# Patient Record
Sex: Female | Born: 1975
Health system: Southern US, Community
[De-identification: ages and names within clinical notes are randomized; demographics above are authoritative.]

## PROBLEM LIST (undated history)

## (undated) DIAGNOSIS — R002 Palpitations: Secondary | ICD-10-CM

## (undated) HISTORY — DX: Palpitations: R00.2

---

## 2001-05-16 ENCOUNTER — Encounter: Payer: Self-pay | Admitting: Emergency Medicine

## 2001-05-16 ENCOUNTER — Emergency Department (HOSPITAL_COMMUNITY): Admission: EM | Admit: 2001-05-16 | Discharge: 2001-05-16 | Payer: Self-pay | Admitting: Internal Medicine

## 2008-01-25 ENCOUNTER — Emergency Department (HOSPITAL_COMMUNITY): Admission: EM | Admit: 2008-01-25 | Discharge: 2008-01-25 | Payer: Self-pay | Admitting: Emergency Medicine

## 2012-01-02 ENCOUNTER — Emergency Department (HOSPITAL_COMMUNITY): Payer: Worker's Compensation

## 2012-01-02 ENCOUNTER — Encounter (HOSPITAL_COMMUNITY): Payer: Self-pay | Admitting: Emergency Medicine

## 2012-01-02 ENCOUNTER — Emergency Department (HOSPITAL_COMMUNITY)
Admission: EM | Admit: 2012-01-02 | Discharge: 2012-01-02 | Disposition: A | Discharge status: Home / Self Care | Payer: Worker's Compensation | Attending: Emergency Medicine | Admitting: Emergency Medicine

## 2012-01-02 DIAGNOSIS — M545 Low back pain, unspecified: Secondary | ICD-10-CM | POA: Insufficient documentation

## 2012-01-02 DIAGNOSIS — S39012A Strain of muscle, fascia and tendon of lower back, initial encounter: Secondary | ICD-10-CM

## 2012-01-02 DIAGNOSIS — IMO0001 Reserved for inherently not codable concepts without codable children: Secondary | ICD-10-CM | POA: Insufficient documentation

## 2012-01-02 DIAGNOSIS — Y93F2 Activity, caregiving, lifting: Secondary | ICD-10-CM | POA: Insufficient documentation

## 2012-01-02 DIAGNOSIS — Y9210 Unspecified residential institution as the place of occurrence of the external cause: Secondary | ICD-10-CM | POA: Insufficient documentation

## 2012-01-02 DIAGNOSIS — S335XXA Sprain of ligaments of lumbar spine, initial encounter: Secondary | ICD-10-CM | POA: Insufficient documentation

## 2012-01-02 DIAGNOSIS — X500XXA Overexertion from strenuous movement or load, initial encounter: Secondary | ICD-10-CM | POA: Insufficient documentation

## 2012-01-02 MED ORDER — DIAZEPAM 5 MG PO TABS: 5.0000 mg | ORAL_TABLET | Freq: Three times a day (TID) | ORAL | Status: AC | PRN

## 2012-01-02 MED ORDER — IBUPROFEN 400 MG PO TABS: 400.0000 mg | ORAL_TABLET | Freq: Four times a day (QID) | ORAL | Status: AC | PRN

## 2012-01-02 MED ORDER — IBUPROFEN 800 MG PO TABS
800.0000 mg | ORAL_TABLET | Freq: Once | ORAL | Status: AC
  Administered 2012-01-02: 800 mg via ORAL
  Filled 2012-01-02: qty 1

## 2012-01-02 MED ORDER — HYDROCODONE-ACETAMINOPHEN 5-325 MG PO TABS: 1.0000 | ORAL_TABLET | ORAL | Status: AC | PRN

## 2012-01-02 NOTE — ED Provider Notes (Signed)
History     CSN: [id number]  Arrival date & time 01/02/12  0028   First MD Initiated Contact with Patient 01/02/12 0120      Chief Complaint  Patient presents with  . Back Pain    HPI: Patient is a 36 y.o. female presenting with back pain. The history is provided by the patient.  Back Pain  This is a new problem.  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 States she heard a pop in her lower back and has had severe LBP since. States pain is burning in nature and radiates slightly into her (R) buttocks. Denies numbness, tingling or weakness to the RLE.  History reviewed. No pertinent past medical history.  History reviewed. No pertinent past surgical history.  No family history on file.  History  Substance Use Topics  . Smoking status: Never Smoker   . Smokeless tobacco: Not on file  . Alcohol Use: Yes    OB History    Grav Para Term Preterm Abortions TAB SAB Ect Mult Living                  Review of Systems  Constitutional: Negative.   HENT: Negative.   Eyes: Negative.   Respiratory: Negative.   Cardiovascular: Negative.   Gastrointestinal: Negative.   Genitourinary: Negative.   Musculoskeletal: Positive for back pain.  Skin: Negative.   Neurological: Negative.   Hematological: Negative.   Psychiatric/Behavioral: Negative.     Allergies  Review of patient's allergies indicates no known allergies.  Home Medications   Current Outpatient Rx  Name Route Sig Dispense Refill  . DIPHENHYDRAMINE-APAP (SLEEP) 25-500 MG PO TABS Oral Take 1 tablet by mouth at bedtime as needed.    Marland Kitchen OVER THE COUNTER MEDICATION Oral Take 2 tablets by mouth daily. OTC flinstones chewable vitamins    . DIAZEPAM 5 MG PO TABS Oral Take 1 tablet (5 mg total) by mouth every 8 (eight) hours as needed (Muscle pain/spasm). 10 tablet 0  . HYDROCODONE-ACETAMINOPHEN 5-325 MG PO TABS Oral Take 1 tablet by mouth every 4 (four)  hours as needed for pain. 15 tablet 0  . IBUPROFEN 400 MG PO TABS Oral Take 1 tablet (400 mg total) by mouth every 6 (six) hours as needed for pain (1 PO TID x 3 days then PRN only). 15 tablet 0    BP 136/89  Pulse 71  Temp(Src) 97.4 F (36.3 C) (Oral)  Resp 18  SpO2 99%  LMP 12/06/2011  Physical Exam  Constitutional: She is oriented to person, place, and time. She appears well-developed and well-nourished.  HENT:  Head: Normocephalic and atraumatic.  Eyes: Conjunctivae are normal.  Neck: Neck supple.  Cardiovascular: Normal rate and regular rhythm.   Pulmonary/Chest: Effort normal and breath sounds normal.  Abdominal: Soft. Bowel sounds are normal.  Musculoskeletal: Normal range of motion.       Back:  Neurological: She is alert and oriented to person, place, and time. She has normal reflexes.  Skin: Skin is warm and dry. No erythema.  Psychiatric: She has a normal mood and affect.    ED Course  ProceduresFindings and clinical impression discussed w/ pt. Will plan for d/c home w/ tx for lumbosacral strain and provide ortho referral for f/u if not improving over next several days. Pt agreeable w/ plan.  Labs Reviewed - No data to display Dg Lumbar Spine Complete  01/02/2012  *  RADIOLOGY REPORT*  Clinical Data: Low back pain after transferring from bed to share.  LUMBAR SPINE - COMPLETE 4+ VIEW  Comparison: None.  Findings: Five lumbar type vertebra.  Normal alignment of lumbar vertebra and facet joints.  No vertebral compression deformities. Intervertebral disc space heights are preserved.  Bone cortex and trabecular architecture appear intact.  No focal bone lesion or bone destruction.  IMPRESSION: No displaced fractures identified.  Original Report Authenticated By: Marlon Pel, M.D.     1. Lumbar strain       MDM  HPI/PE and clinical findings c/w lumbosacral strain/sprain  Xray w/o acute findings. No focal neurological findings        Leanne Chang,  NP 01/02/12 [phone]

## 2012-01-02 NOTE — ED Notes (Signed)
Lab at bedside to obtain UDS for work.

## 2012-01-02 NOTE — ED Notes (Signed)
Pt c/o lower back pain.  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

## 2012-01-04 NOTE — ED Provider Notes (Signed)
Medical screening examination/treatment/procedure(s) were performed by non-physician practitioner and as supervising physician I was immediately available for consultation/collaboration.  Chief Walkup, MD 01/04/12 0642 

## 2014-08-28 ENCOUNTER — Other Ambulatory Visit: Payer: Self-pay | Admitting: Physician Assistant

## 2014-08-28 ENCOUNTER — Other Ambulatory Visit (HOSPITAL_COMMUNITY)
Admission: RE | Admit: 2014-08-28 | Discharge: 2014-08-28 | Disposition: A | Discharge status: Home / Self Care | Payer: 59 | Source: Ambulatory Visit | Attending: Family Medicine | Admitting: Family Medicine

## 2014-08-28 DIAGNOSIS — Z124 Encounter for screening for malignant neoplasm of cervix: Secondary | ICD-10-CM | POA: Diagnosis not present

## 2014-08-29 LAB — CYTOLOGY - PAP

## 2016-12-05 DIAGNOSIS — E611 Iron deficiency: Secondary | ICD-10-CM

## 2016-12-05 HISTORY — DX: Iron deficiency: E61.1

## 2017-09-08 ENCOUNTER — Other Ambulatory Visit: Payer: Self-pay | Admitting: Obstetrics & Gynecology

## 2017-09-08 DIAGNOSIS — Z1231 Encounter for screening mammogram for malignant neoplasm of breast: Secondary | ICD-10-CM

## 2017-09-14 ENCOUNTER — Ambulatory Visit
Admission: RE | Admit: 2017-09-14 | Discharge: 2017-09-14 | Disposition: A | Discharge status: Home / Self Care | Payer: BLUE CROSS/BLUE SHIELD | Source: Ambulatory Visit | Attending: Obstetrics & Gynecology | Admitting: Obstetrics & Gynecology

## 2017-09-14 DIAGNOSIS — Z1231 Encounter for screening mammogram for malignant neoplasm of breast: Secondary | ICD-10-CM

## 2017-09-20 ENCOUNTER — Ambulatory Visit: Payer: Self-pay

## 2018-09-12 ENCOUNTER — Other Ambulatory Visit: Payer: Self-pay | Admitting: Obstetrics & Gynecology

## 2018-09-12 DIAGNOSIS — Z1231 Encounter for screening mammogram for malignant neoplasm of breast: Secondary | ICD-10-CM

## 2018-09-20 ENCOUNTER — Ambulatory Visit
Admission: RE | Admit: 2018-09-20 | Discharge: 2018-09-20 | Disposition: A | Discharge status: Home / Self Care | Payer: BLUE CROSS/BLUE SHIELD | Source: Ambulatory Visit | Attending: Obstetrics & Gynecology | Admitting: Obstetrics & Gynecology

## 2018-09-20 DIAGNOSIS — Z1231 Encounter for screening mammogram for malignant neoplasm of breast: Secondary | ICD-10-CM

## 2019-09-25 ENCOUNTER — Other Ambulatory Visit: Payer: Self-pay | Admitting: Obstetrics & Gynecology

## 2019-09-25 DIAGNOSIS — Z1231 Encounter for screening mammogram for malignant neoplasm of breast: Secondary | ICD-10-CM

## 2019-11-14 ENCOUNTER — Ambulatory Visit
Admission: RE | Admit: 2019-11-14 | Discharge: 2019-11-14 | Disposition: A | Discharge status: Home / Self Care | Payer: BC Managed Care – PPO | Source: Ambulatory Visit | Attending: Obstetrics & Gynecology | Admitting: Obstetrics & Gynecology

## 2019-11-14 ENCOUNTER — Other Ambulatory Visit: Payer: Self-pay

## 2019-11-14 DIAGNOSIS — Z1231 Encounter for screening mammogram for malignant neoplasm of breast: Secondary | ICD-10-CM

## 2020-07-13 ENCOUNTER — Ambulatory Visit (HOSPITAL_COMMUNITY)
Admission: EM | Admit: 2020-07-13 | Discharge: 2020-07-13 | Disposition: A | Discharge status: Home / Self Care | Payer: BC Managed Care – PPO | Attending: Family Medicine | Admitting: Family Medicine

## 2020-07-13 ENCOUNTER — Encounter (HOSPITAL_COMMUNITY): Payer: Self-pay

## 2020-07-13 ENCOUNTER — Other Ambulatory Visit: Payer: Self-pay

## 2020-07-13 DIAGNOSIS — I440 Atrioventricular block, first degree: Secondary | ICD-10-CM | POA: Insufficient documentation

## 2020-07-13 DIAGNOSIS — R002 Palpitations: Secondary | ICD-10-CM | POA: Insufficient documentation

## 2020-07-13 LAB — CBC
HCT: 35.5 % — ABNORMAL LOW (ref 36.0–46.0)
Hemoglobin: 11.4 g/dL — ABNORMAL LOW (ref 12.0–15.0)
MCH: 22.3 pg — ABNORMAL LOW (ref 26.0–34.0)
MCHC: 32.1 g/dL (ref 30.0–36.0)
MCV: 69.3 fL — ABNORMAL LOW (ref 80.0–100.0)
Platelets: 243 10*3/uL (ref 150–400)
RBC: 5.12 MIL/uL — ABNORMAL HIGH (ref 3.87–5.11)
RDW: 19.9 % — ABNORMAL HIGH (ref 11.5–15.5)
WBC: 6.5 10*3/uL (ref 4.0–10.5)
nRBC: 0 % (ref 0.0–0.2)

## 2020-07-13 LAB — BASIC METABOLIC PANEL
Anion gap: 8 (ref 5–15)
BUN: 9 mg/dL (ref 6–20)
CO2: 25 mmol/L (ref 22–32)
Calcium: 8.8 mg/dL — ABNORMAL LOW (ref 8.9–10.3)
Chloride: 100 mmol/L (ref 98–111)
Creatinine, Ser: 0.84 mg/dL (ref 0.44–1.00)
GFR calc Af Amer: >60 mL/min (ref >60)
GFR calc non Af Amer: >60 mL/min (ref >60)
Glucose, Bld: 80 mg/dL (ref 70–99)
Potassium: 3.5 mmol/L (ref 3.5–5.1)
Sodium: 133 mmol/L — ABNORMAL LOW (ref 135–145)

## 2020-07-13 LAB — TSH: TSH: 1.675 u[IU]/mL (ref 0.350–4.500)

## 2020-07-13 NOTE — ED Provider Notes (Signed)
[facility]    CSN: [id number] Arrival date & time: 07/13/20  1751      History   Chief Complaint Chief Complaint  Patient presents with  . Hypertension    HPI Cheryl Flynn is a 44 y.o. female.   Patient reports for concerns of high blood pressure and palpitations.  She reports she took her blood pressure at work and was 140/100.  She reports no history of blood pressure.  She reports over the last few days she has noticed feeling like she has had some heart palpitations.  Denies chest pain.  She reports feeling" like a panicky feeling" when this occurred.  This did not last very long.  She reports there were times and she felt like she could not get all of her air.  She reports this is subsided and is breathing well in clinic.  She continues to endorse any chest pain.  No recent lower leg swelling.  She reports she has had similar episodes before.  She reports in January when she was flying she had a similar episode where she actually blacked out.  She denies feeling like she might of blacked out today.  She denies any heat or cold intolerance.  She actually endorses some weight gain she attributes to Covid.  She does not have a primary care provider.  Denies dizziness.  Denies numbness or tingling.     History reviewed. No pertinent past medical history.  There are no problems to display for this patient.   History reviewed. No pertinent surgical history.  OB History   No obstetric history on file.      Home Medications    Prior to Admission medications   Medication Sig Start Date End Date Taking? Authorizing Provider  diphenhydramine-acetaminophen (TYLENOL PM) 25-500 MG TABS Take 1 tablet by mouth at bedtime as needed.    [provider]  OVER THE COUNTER MEDICATION Take 2 tablets by mouth daily. OTC flinstones chewable vitamins    [provider]    Family History Family History  Problem Relation Age of Onset  . Breast cancer Neg Hx      Social History Social History   Tobacco Use  . Smoking status: Never Smoker  . Smokeless tobacco: Never Used  Substance Use Topics  . Alcohol use: Yes    Comment: occ  . Drug use: No     Allergies   Patient has no known allergies.   Review of Systems Review of Systems   Physical Exam Triage Vital Signs ED Triage Vitals [07/13/20 1857]  Enc Vitals Group     BP 129/88     Pulse Rate 70     Resp 16     Temp 97.8 F (36.6 C)     Temp Source Oral     SpO2 100 %     Weight 190 lb (86.2 kg)     Height 5\' 10"  (1.778 m)     Head Circumference      Peak Flow      Pain Score 0     Pain Loc      Pain Edu?      Excl. in GC?    No data found.  Updated Vital Signs BP 129/88   Pulse 70   Temp 97.8 F (36.6 C) (Oral)   Resp 16   Ht 5\' 10"  (1.778 m)   Wt 190 lb (86.2 kg)   SpO2 100%   BMI 27.26 kg/m   Visual  Acuity Right Eye Distance:   Left Eye Distance:   Bilateral Distance:    Right Eye Near:   Left Eye Near:    Bilateral Near:     Physical Exam Vitals and nursing note reviewed.  Constitutional:      General: She is not in acute distress.    Appearance: She is well-developed.  HENT:     Head: Normocephalic and atraumatic.  Eyes:     Conjunctiva/sclera: Conjunctivae normal.  Cardiovascular:     Rate and Rhythm: Normal rate and regular rhythm.     Heart sounds: No murmur heard.   Pulmonary:     Effort: Pulmonary effort is normal. No respiratory distress.     Breath sounds: Normal breath sounds. No wheezing, rhonchi or rales.  Abdominal:     Palpations: Abdomen is soft.     Tenderness: There is no abdominal tenderness.  Musculoskeletal:     Cervical back: Neck supple.     Right lower leg: No edema.     Left lower leg: No edema.  Skin:    General: Skin is warm and dry.  Neurological:     Mental Status: She is alert.      UC Treatments / Results  Labs (all labs ordered are listed, but only abnormal results are displayed) Labs  Reviewed  BASIC METABOLIC PANEL - Abnormal; Notable for the following components:      Result Value   Sodium 133 (*)    Calcium 8.8 (*)    All other components within normal limits  CBC - Abnormal; Notable for the following components:   RBC 5.12 (*)    Hemoglobin 11.4 (*)    HCT 35.5 (*)    MCV 69.3 (*)    MCH 22.3 (*)    RDW 19.9 (*)    All other components within normal limits  TSH    EKG Sinus rhythm with first-degree AV block.  No ST or T wave abnormality.  Otherwise normal EKG  Radiology No results found.  Procedures Procedures (including critical care time)  Medications Ordered in UC Medications - No data to display  Initial Impression / Assessment and Plan / UC Course  I have reviewed the triage vital signs and the nursing notes.  Pertinent labs & imaging results that were available during my care of the patient were reviewed by me and considered in my medical decision making (see chart for details).     #First-degree AV block #Palpitations Patient is a 44 year old presenting with history of palpitations and found to have first-degree AV block on EKG.  Reassuring exam here.  Potassium normal.  TSH normal.  Very mild anemia..  Otherwise labs largely unrevealing.  Given symptoms have resolved and reassuring work-up in clinic, will discharge with instructions to follow-up with cardiology and establish with primary care.  Strict emergency department precautions discussed.  Patient verbalized understanding plan of care. Final Clinical Impressions(s) / UC Diagnoses   Final diagnoses:  1st degree AV block  Palpitations     Discharge Instructions     We have drawn basic labs, we will call if there are significant abnormalities  Call the cardiology group tomorrow for evaluation  Establish with the internal medicine center  If you were to have  chest pain, shortness of breath, feel like you blacked out, go to the emergency department      ED Prescriptions     None     PDMP not reviewed this encounter.   Robyne Matar, Veryl Speak,  PA-C 07/13/20 2146

## 2020-07-13 NOTE — Discharge Instructions (Signed)
We have drawn basic labs, we will call if there are significant abnormalities  Call the cardiology group tomorrow for evaluation  Establish with the internal medicine center  If you were to have  chest pain, shortness of breath, feel like you blacked out, go to the emergency department

## 2020-07-13 NOTE — ED Triage Notes (Signed)
Pt c/o HTn today 145/101, fatigue, heart fluttering started yesterday. Pt denies chest pain. Pt states has some SOB. Pt has non labored breathing.

## 2020-07-14 ENCOUNTER — Telehealth (HOSPITAL_COMMUNITY): Payer: Self-pay | Admitting: Physician Assistant

## 2020-07-14 MED ORDER — VITAMIN C 250 MG PO TABS
500.0000 mg | ORAL_TABLET | Freq: Every day | ORAL | 0 refills | Status: DC
Start: 2020-07-14 — End: 2021-05-31

## 2020-07-14 MED ORDER — FERROUS SULFATE 325 (65 FE) MG PO TABS
325.0000 mg | ORAL_TABLET | Freq: Every day | ORAL | 0 refills | Status: DC
Start: 2020-07-14 — End: 2020-07-23

## 2020-07-14 NOTE — Telephone Encounter (Cosign Needed)
Patient called to discuss labs. Discussed mild microcytic anemia at 11.4, will start on 325 ferous sulfate with vitamin C. Instructed her to schedule with primary care for follow up. Patient verbalized understanding plan of care

## 2020-07-22 ENCOUNTER — Encounter: Payer: BLUE CROSS/BLUE SHIELD | Admitting: Student

## 2020-07-23 ENCOUNTER — Other Ambulatory Visit: Payer: Self-pay

## 2020-07-23 ENCOUNTER — Ambulatory Visit (INDEPENDENT_AMBULATORY_CARE_PROVIDER_SITE_OTHER): Payer: BC Managed Care – PPO | Admitting: Student

## 2020-07-23 ENCOUNTER — Encounter: Payer: Self-pay | Admitting: Student

## 2020-07-23 DIAGNOSIS — E663 Overweight: Secondary | ICD-10-CM | POA: Diagnosis not present

## 2020-07-23 DIAGNOSIS — D509 Iron deficiency anemia, unspecified: Secondary | ICD-10-CM | POA: Insufficient documentation

## 2020-07-23 DIAGNOSIS — Z0001 Encounter for general adult medical examination with abnormal findings: Secondary | ICD-10-CM

## 2020-07-23 DIAGNOSIS — R002 Palpitations: Secondary | ICD-10-CM | POA: Insufficient documentation

## 2020-07-23 DIAGNOSIS — K5900 Constipation, unspecified: Secondary | ICD-10-CM | POA: Insufficient documentation

## 2020-07-23 DIAGNOSIS — Z0000 Encounter for general adult medical examination without abnormal findings: Secondary | ICD-10-CM

## 2020-07-23 DIAGNOSIS — K5904 Chronic idiopathic constipation: Secondary | ICD-10-CM

## 2020-07-23 LAB — GLUCOSE, CAPILLARY: Glucose-Capillary: 78 mg/dL (ref 70–99)

## 2020-07-23 MED ORDER — FERROUS SULFATE 325 (65 FE) MG PO TABS: 325.0000 mg | ORAL_TABLET | ORAL | 0 refills | Status: DC

## 2020-07-23 NOTE — Assessment & Plan Note (Signed)
Patient is concerned about her weight and reports a 30 pounds weight gain since the COVID pandemic began. Attributes this to changes in diet and exercise. She has been trying to eat better, but states she vegetables she buys often go bad before she gets around to eating them.  -counseled patient on diet and exercise -encouraged the patient to follow through with her planned diet changes

## 2020-07-23 NOTE — Patient Instructions (Addendum)
Thank you for allowing [place] to be a part of your care today, it was pleasure seeing you. We discussed your microcytic anemia, palpitations, weight, and constipation.  I am checking these labs: iron, total iron binding capacity, ferritin, lipid panel, A1c, HIV screen, Hepatitis C antibody  I have made these changes to your medications: Refilled your ferrous sulfate, you can take this every other day     I sent a refill, but you may complete your previous prescription first.  For your constipation, the diet changes that we discussed (e.g. leafy greens) will help by increasing your fiber intake. You can further increase this by adding metamucil, benefiber, or fiber one. Aim for a bowel roughly every 1-2 days.  Your palpitations sound correlated with anxiety, and your workup so far has been reassuring. Keep you appointment with cardiology.   Please follow up in 3 months to recheck your iron.   Thank you, and please call the Internal Medicine Clinic at [phone] if you have any questions.  Best, Dr. Imogene Burn

## 2020-07-23 NOTE — Assessment & Plan Note (Signed)
Reports her baseline is a bowel movement every week and a half. Notes that she has a poor diet and does not eat very much fiber. She is planning diet changes to address her weight.  -encouraged patient to follow through on planned diet changes, which should increase her fiber intake -instructed her to use metamucil/fiber one as needed to increase her BM frequency -changed her ferrous sulfate to every other day

## 2020-07-23 NOTE — Assessment & Plan Note (Signed)
Patient reports episodes of palpitations and anxiety which are new. They began when her brother came to stay with her over a weekend, and she notes that she felt she was out of control of the situation. These resolved after he left, then occurred one other time when she felt stressed. She denies anxiety other than these episodes. Denies prior diagnosis of anxiety. Denies depression. Workup at Urgent Care with normal EKG and TSH, mild anemia found.   -follow up with cardiology scheduled -declines further workup/treatment of anxiety at this time

## 2020-07-23 NOTE — Assessment & Plan Note (Signed)
At recent Urgent Care was noted to have hgb of 11.4 and MCV of 69.3. She reports a history of iron deficiency anemia which was previously treated with iron by her OBGYN. Stopped taking this 2 years ago when her anemia was resolved. She is perimenopausal, last period was in February. Does not recall any blood in stool or bleeding from other sources.   -continue ferrous sulfate 325mg  every other day -check Fe, TIBC, ferritin today -f/u in 3 months for repeat levels -no clear cause of blood loss, consider GI workup if Fe levels do not normalize

## 2020-07-23 NOTE — Assessment & Plan Note (Addendum)
-  vaccinations up to date -FSBS, lipid panel -Hep C, HIV screen

## 2020-07-23 NOTE — Progress Notes (Signed)
   CC: urgent follow up on palpitations, establish care  HPI:  Cheryl Flynn is a 44 y.o. female with history as below presenting for urgent care follow up on palpitations and to establish care. Please refer to problem based charting for further details of assessment and plan of current problem and chronic medical conditions.  Past Medical History:  Diagnosis Date  . Iron deficiency 2018    Current Outpatient Medications  Medication Instructions  . diphenhydramine-acetaminophen (TYLENOL PM) 25-500 MG TABS 1 tablet, At bedtime PRN  . ferrous sulfate 325 mg, Oral, Every other day  . OVER THE COUNTER MEDICATION 2 tablets, Daily  . vitamin C (ASCORBIC ACID) 500 mg, Oral, Daily   Past Surgical History:  Procedure Laterality Date  . CESAREAN SECTION  2004   Family History  Problem Relation Age of Onset  . Breast cancer Neg Hx    Social History   Tobacco Use  . Smoking status: Never Smoker  . Smokeless tobacco: Never Used  Substance Use Topics  . Alcohol use: Yes    Comment: 1 drink a month  . Drug use: No    Review of Systems:   Review of Systems  Constitutional: Negative for chills, fever and weight loss.  HENT: Negative for congestion and sore throat.   Respiratory: Negative for cough and shortness of breath.   Cardiovascular: Positive for palpitations. Negative for chest pain.  Gastrointestinal: Negative for abdominal pain, constipation, diarrhea, nausea and vomiting.  Genitourinary: Negative for dysuria and frequency.  Skin: Negative for itching and rash.  Neurological: Negative for dizziness and headaches.  Psychiatric/Behavioral: Negative for depression. The patient is nervous/anxious.      Physical Exam: Vitals:   07/23/20 1339  BP: 138/89  Pulse: (!) 59  Temp: 98.4 F (36.9 C)  TempSrc: Oral  SpO2: 100%  Weight: 208 lb 9.6 oz (94.6 kg)  Height: 5\' 10"  (1.778 m)   Constitutional: no acute distress Head: atraumatic ENT: external ears  normal Cardiovascular: regular rate and rhythm, normal heart sounds Pulmonary: effort normal, normal breath sounds bilaterally Abdominal: flat, nontender, no rebound tenderness, bowel sounds normal Skin: warm and dry Neurological: alert, no focal deficit Psychiatric: normal mood and affect  Assessment & Plan:   See Encounters Tab for problem based charting.  Patient seen with Dr. 

## 2020-07-24 ENCOUNTER — Other Ambulatory Visit: Payer: Self-pay

## 2020-07-24 ENCOUNTER — Ambulatory Visit (HOSPITAL_COMMUNITY)
Admission: EM | Admit: 2020-07-24 | Discharge: 2020-07-24 | Disposition: A | Discharge status: Home / Self Care | Payer: BC Managed Care – PPO | Attending: Family Medicine | Admitting: Family Medicine

## 2020-07-24 DIAGNOSIS — Z1152 Encounter for screening for COVID-19: Secondary | ICD-10-CM | POA: Diagnosis not present

## 2020-07-24 DIAGNOSIS — Z20822 Contact with and (suspected) exposure to covid-19: Secondary | ICD-10-CM | POA: Insufficient documentation

## 2020-07-24 LAB — LIPID PANEL
Chol/HDL Ratio: 2.9 ratio (ref 0.0–4.4)
Cholesterol, Total: 171 mg/dL (ref 100–199)
HDL: 59 mg/dL (ref >39)
LDL Chol Calc (NIH): 101 mg/dL — ABNORMAL HIGH (ref 0–99)
Triglycerides: 56 mg/dL (ref 0–149)
VLDL Cholesterol Cal: 11 mg/dL (ref 5–40)

## 2020-07-24 LAB — FERRITIN: Ferritin: 10 ng/mL — ABNORMAL LOW (ref 15–150)

## 2020-07-24 LAB — HEPATITIS C ANTIBODY: Hep C Virus Ab: <0.1 s/co ratio (ref 0.0–0.9)

## 2020-07-24 LAB — IRON AND TIBC
Iron Saturation: 19 % (ref 15–55)
Iron: 76 ug/dL (ref 27–159)
Total Iron Binding Capacity: 404 ug/dL (ref 250–450)
UIBC: 328 ug/dL (ref 131–425)

## 2020-07-24 LAB — HIV ANTIBODY (ROUTINE TESTING W REFLEX): HIV Screen 4th Generation wRfx: NONREACTIVE

## 2020-07-24 NOTE — Discharge Instructions (Signed)
If your Covid-19 test is positive, you will get a phone call from [facility] regarding your results. If your Covid-19 test is negative, you will NOT get a phone call from [facility] with your results. You may view your results on MyChart. If you do not have a MyChart account, sign up instructions are in your discharge papers. ° °

## 2020-07-24 NOTE — ED Triage Notes (Signed)
Pt presents for covid testing with no known symptoms. 

## 2020-07-24 NOTE — Progress Notes (Signed)
Internal Medicine Clinic Attending  I saw and evaluated the patient.  I personally confirmed the key portions of the history and exam documented by Dr. Chen and I reviewed pertinent patient test results.  The assessment, diagnosis, and plan were formulated together and I agree with the documentation in the resident's note.  

## 2020-07-25 LAB — SARS CORONAVIRUS 2 (TAT 6-24 HRS): SARS Coronavirus 2: NEGATIVE

## 2020-08-21 NOTE — Progress Notes (Deleted)
Cardiology Office Note:    Date:  08/21/2020   ID:  Cheryl Flynn, DOB 1976-02-18, MRN [id number]  PCP:  Remo Lipps, MD  Cardiologist:  No primary care provider on file.  Electrophysiologist:  None   Referring MD: Myna Hidalgo, DO   No chief complaint on file. ***  History of Present Illness:    Cheryl Flynn is a 44 y.o. female with a hx of iron deficiency who is referred by Dr. Charlotta Newton for evaluation of palpitations.  Past Medical History:  Diagnosis Date  . Iron deficiency 2018    Past Surgical History:  Procedure Laterality Date  . CESAREAN SECTION  2004    Current Medications: No outpatient medications have been marked as taking for the 08/24/20 encounter (Appointment) with Little Ishikawa, MD.     Allergies:   Patient has no known allergies.   Social History   Socioeconomic History  . Marital status: Single    Spouse name: Not on file  . Number of children: Not on file  . Years of education: Not on file  . Highest education level: Not on file  Occupational History  . Not on file  Tobacco Use  . Smoking status: Never Smoker  . Smokeless tobacco: Never Used  Substance and Sexual Activity  . Alcohol use: Yes    Comment: 1 drink a month  . Drug use: No  . Sexual activity: Not on file  Other Topics Concern  . Not on file  Social History Narrative  . Not on file   Social Determinants of Health   Financial Resource Strain:   . Difficulty of Paying Living Expenses: Not on file  Food Insecurity:   . Worried About Programme researcher, broadcasting/film/video in the Last Year: Not on file  . Ran Out of Food in the Last Year: Not on file  Transportation Needs:   . Lack of Transportation (Medical): Not on file  . Lack of Transportation (Non-Medical): Not on file  Physical Activity:   . Days of Exercise per Week: Not on file  . Minutes of Exercise per Session: Not on file  Stress:   . Feeling of Stress : Not on file  Social Connections:   . Frequency of  Communication with Friends and Family: Not on file  . Frequency of Social Gatherings with Friends and Family: Not on file  . Attends Religious Services: Not on file  . Active Member of Clubs or Organizations: Not on file  . Attends Banker Meetings: Not on file  . Marital Status: Not on file     Family History: The patient's ***family history is negative for Breast cancer.  ROS:   Please see the history of present illness.    *** All other systems reviewed and are negative.  EKGs/Labs/Other Studies Reviewed:    The following studies were reviewed today: ***  EKG:  EKG is *** ordered today.  The ekg ordered today demonstrates ***  Recent Labs: 07/13/2020: BUN 9; Creatinine, Ser 0.84; Hemoglobin 11.4; Platelets 243; Potassium 3.5; Sodium 133; TSH 1.675  Recent Lipid Panel    Component Value Date/Time   CHOL 171 07/23/2020 1435   TRIG 56 07/23/2020 1435   HDL 59 07/23/2020 1435   CHOLHDL 2.9 07/23/2020 1435   LDLCALC 101 (H) 07/23/2020 1435    Physical Exam:    VS:  There were no vitals taken for this visit.    Wt Readings from Last 3 Encounters:  07/23/20 208  lb 9.6 oz (94.6 kg)  07/13/20 190 lb (86.2 kg)     GEN: *** Well nourished, well developed in no acute distress HEENT: Normal NECK: No JVD; No carotid bruits LYMPHATICS: No lymphadenopathy CARDIAC: ***RRR, no murmurs, rubs, gallops RESPIRATORY:  Clear to auscultation without rales, wheezing or rhonchi  ABDOMEN: Soft, non-tender, non-distended MUSCULOSKELETAL:  No edema; No deformity  SKIN: Warm and dry NEUROLOGIC:  Alert and oriented x 3 PSYCHIATRIC:  Normal affect   ASSESSMENT:    No diagnosis found. PLAN:    In order of problems listed above:  1. ***   Medication Adjustments/Labs and Tests Ordered: Current medicines are reviewed at length with the patient today.  Concerns regarding medicines are outlined above.  No orders of the defined types were placed in this encounter.  No  orders of the defined types were placed in this encounter.   There are no Patient Instructions on file for this visit.   Signed, Little Ishikawa, MD  08/21/2020 12:58 PM    [facility]

## 2020-08-24 ENCOUNTER — Ambulatory Visit: Payer: BC Managed Care – PPO | Admitting: Cardiology

## 2020-09-20 NOTE — Progress Notes (Signed)
Cardiology Office Note:    Date:  09/22/2020   ID:  Cheryl Flynn, DOB 13-Mar-1976, MRN [id number]  PCP:  Remo Lipps, MD  Cardiologist:  No primary care provider on file.  Electrophysiologist:  None   Referring MD: Remo Lipps, MD   Chief Complaint  Patient presents with  . Palpitations    History of Present Illness:    Cheryl Flynn is a 44 y.o. female with a hx of iron deficiency who is referred by Dr. Imogene Burn for evaluation of palpitations.  She reports that she had COVID-19 infection in January 2021.  Following this had syncopal episode while flying to [place]  Had an additional syncopal episode on flight back.  Reports she felt diaphoretic, had tunnel vision, and then passed out.  Reports only lost consciousness for a few seconds.  States that in August she started having palpitations.  Described as feeling like heart is fluttering.  Last for about 30 seconds, occurs about about once every 2 weeks.  She denies any lightheadedness or syncope during episodes.  States that she exercises 1-2 times a week by either doing a 30-minute walk for 20 to 30-minute cardio exercise.  She denies any chest pain or dyspnea.  No smoking history.  No history of heart disease in her immediate family.   Past Medical History:  Diagnosis Date  . Iron deficiency 2018    Past Surgical History:  Procedure Laterality Date  . CESAREAN SECTION  2004    Current Medications: Current Meds  Medication Sig  . diphenhydramine-acetaminophen (TYLENOL PM) 25-500 MG TABS Take 1 tablet by mouth at bedtime as needed.  . ferrous sulfate 325 (65 FE) MG tablet Take 1 tablet (325 mg total) by mouth every other day.  Marland Kitchen OVER THE COUNTER MEDICATION Take 2 tablets by mouth daily. OTC flinstones chewable vitamins  . vitamin C (ASCORBIC ACID) 250 MG tablet Take 2 tablets (500 mg total) by mouth daily.     Allergies:   Patient has no known allergies.   Social History   Socioeconomic History  .  Marital status: Single    Spouse name: Not on file  . Number of children: Not on file  . Years of education: Not on file  . Highest education level: Not on file  Occupational History  . Not on file  Tobacco Use  . Smoking status: Never Smoker  . Smokeless tobacco: Never Used  Substance and Sexual Activity  . Alcohol use: Yes    Comment: 1 drink a month  . Drug use: No  . Sexual activity: Not on file  Other Topics Concern  . Not on file  Social History Narrative  . Not on file   Social Determinants of Health   Financial Resource Strain:   . Difficulty of Paying Living Expenses: Not on file  Food Insecurity:   . Worried About Programme researcher, broadcasting/film/video in the Last Year: Not on file  . Ran Out of Food in the Last Year: Not on file  Transportation Needs:   . Lack of Transportation (Medical): Not on file  . Lack of Transportation (Non-Medical): Not on file  Physical Activity:   . Days of Exercise per Week: Not on file  . Minutes of Exercise per Session: Not on file  Stress:   . Feeling of Stress : Not on file  Social Connections:   . Frequency of Communication with Friends and Family: Not on file  . Frequency of Social Gatherings  with Friends and Family: Not on file  . Attends Religious Services: Not on file  . Active Member of Clubs or Organizations: Not on file  . Attends Banker Meetings: Not on file  . Marital Status: Not on file     Family History: The patient's family history is negative for Breast cancer.  ROS:   Please see the history of present illness.     All other systems reviewed and are negative.  EKGs/Labs/Other Studies Reviewed:    The following studies were reviewed today:   EKG:  EKG is ordered today.  The ekg ordered today demonstrates normal sinus rhythm, rate 64, no ST/T abnormalities  Recent Labs: 07/13/2020: BUN 9; Creatinine, Ser 0.84; Hemoglobin 11.4; Platelets 243; Potassium 3.5; Sodium 133; TSH 1.675  Recent Lipid Panel     Component Value Date/Time   CHOL 171 07/23/2020 1435   TRIG 56 07/23/2020 1435   HDL 59 07/23/2020 1435   CHOLHDL 2.9 07/23/2020 1435   LDLCALC 101 (H) 07/23/2020 1435    Physical Exam:    VS:  BP 118/90   Pulse 64   Ht 5\' 10"  (1.778 m)   Wt 204 lb (92.5 kg)   SpO2 97%   BMI 29.27 kg/m     Wt Readings from Last 3 Encounters:  09/22/20 204 lb (92.5 kg)  07/23/20 208 lb 9.6 oz (94.6 kg)  07/13/20 190 lb (86.2 kg)     GEN:  Well nourished, well developed in no acute distress HEENT: Normal NECK: No JVD; No carotid bruits LYMPHATICS: No lymphadenopathy CARDIAC: RRR, no murmurs, rubs, gallops RESPIRATORY:  Clear to auscultation without rales, wheezing or rhonchi  ABDOMEN: Soft, non-tender, non-distended MUSCULOSKELETAL:  No edema; No deformity  SKIN: Warm and dry NEUROLOGIC:  Alert and oriented x 3 PSYCHIATRIC:  Normal affect   ASSESSMENT:    1. Palpitations   2. Syncope and collapse    PLAN:    Palpitations: Description concerning for arrhythmia, will evaluate with Zio patch x2 weeks  Syncope: Reports 2 syncopal episodes in January after being diagnosed with COVID-19.  No syncopal episodes since.  Will check echocardiogram to evaluate for structural heart disease.  Plan Zio patch as above.  RTC in 3 months  Medication Adjustments/Labs and Tests Ordered: Current medicines are reviewed at length with the patient today.  Concerns regarding medicines are outlined above.  Orders Placed This Encounter  Procedures  . LONG TERM MONITOR (3-14 DAYS)  . ECHOCARDIOGRAM COMPLETE   No orders of the defined types were placed in this encounter.   Patient Instructions  Medication Instructions:  Your physician recommends that you continue on your current medications as directed. Please refer to the Current Medication list given to you today.  Testing/Procedures: Your physician has requested that you have an echocardiogram. Echocardiography is a painless test that uses sound  waves to create images of your heart. It provides your doctor with information about the size and shape of your heart and how well your heart's chambers and valves are working. This procedure takes approximately one hour. There are no restrictions for this procedure. This will be done at our [facility] location:  [address]   ZIO XT- Long Term Monitor Instructions   Your physician has requested you wear your ZIO patch monitor 14 days.   This is a single patch monitor.  [employer] supplies one patch monitor per enrollment.  Additional stickers are not available.   Please do not apply  patch if you will be having a Nuclear Stress Test, Echocardiogram, Cardiac CT, MRI, or Chest Xray during the time frame you would be wearing the monitor. The patch cannot be worn during these tests.  You cannot remove and re-apply the ZIO XT patch monitor.   Your ZIO patch monitor will be sent USPS Priority mail from [facility] directly to your home address. The monitor may also be mailed to a PO BOX if home delivery is not available.   It may take 3-5 days to receive your monitor after you have been enrolled.   Once you have received you monitor, please review enclosed instructions.  Your monitor has already been registered assigning a specific monitor serial # to you.   Applying the monitor   Shave hair from upper left chest.   Hold abrader disc by orange tab.  Rub abrader in 40 strokes over left upper chest as indicated in your monitor instructions.   Clean area with 4 enclosed alcohol pads .  Use all pads to assure are is cleaned thoroughly.  Let dry.   Apply patch as indicated in monitor instructions.  Patch will be place under collarbone on left side of chest with arrow pointing upward.   Rub patch adhesive wings for 2 minutes.Remove white label marked "1".  Remove white label marked "2".  Rub patch adhesive wings for 2 additional minutes.   While looking in a mirror, press  and release button in center of patch.  A small green light will flash 3-4 times .  This will be your only indicator the monitor has been turned on.     Do not shower for the first 24 hours.  You may shower after the first 24 hours.   Press button if you feel a symptom. You will hear a small click.  Record Date, Time and Symptom in the Patient Log Book.   When you are ready to remove patch, follow instructions on last 2 pages of Patient Log Book.  Stick patch monitor onto last page of Patient Log Book.   Place Patient Log Book in GrayBlue box.  Use locking tab on box and tape box closed securely.  The Orange and [employer] box has [employer]aid postage on it.  Please place in mailbox as soon as possible.  Your physician should have your test results approximately 7 days after the monitor has been mailed back to [facility].   Call [facility] Customer Care at [phone] if you have questions regarding your ZIO XT patch monitor.  Call them immediately if you see an orange light blinking on your monitor.   If your monitor falls off in less than 4 days contact our Monitor department at [phone].  If your monitor becomes loose or falls off after 4 days call [employer] at [phone] for suggestions on securing your monitor.   Follow-Up: At [facility], you and your health needs are our priority.  As part of our continuing mission to provide you with exceptional heart care, we have created designated Provider Care Teams.  These Care Teams include your primary Cardiologist (physician) and Advanced Practice Providers (APPs -  Physician Assistants and Nurse Practitioners) who all work together to provide you with the care you need, when you need it.  We recommend signing up for the patient portal called "MyChart".  Sign up information is provided on this After Visit Summary.  MyChart is used to connect with patients for Virtual Visits (Telemedicine).  Patients are able to view lab/test  results, encounter  notes, upcoming appointments, etc.  Non-urgent messages can be sent to your provider as well.   To learn more about what you can do with MyChart, go to [email or web address].    Your next appointment:   3 month(s)  The format for your next appointment:   In Person  Provider:   Epifanio Lesches, MD       Signed, Little Ishikawa, MD  09/22/2020 1:02 PM    [facility]

## 2020-09-22 ENCOUNTER — Encounter: Payer: Self-pay | Admitting: Cardiology

## 2020-09-22 ENCOUNTER — Other Ambulatory Visit: Payer: Self-pay

## 2020-09-22 ENCOUNTER — Telehealth: Payer: Self-pay | Admitting: Radiology

## 2020-09-22 ENCOUNTER — Ambulatory Visit (INDEPENDENT_AMBULATORY_CARE_PROVIDER_SITE_OTHER): Payer: BC Managed Care – PPO | Admitting: Cardiology

## 2020-09-22 VITALS — BP 118/90 | HR 64 | Ht 70.0 in | Wt 204.0 lb

## 2020-09-22 DIAGNOSIS — R002 Palpitations: Secondary | ICD-10-CM

## 2020-09-22 DIAGNOSIS — R55 Syncope and collapse: Secondary | ICD-10-CM | POA: Diagnosis not present

## 2020-09-22 NOTE — Telephone Encounter (Signed)
Enrolled patient for a 14 day Zio XT  monitor to be mailed to patients home  °

## 2020-09-22 NOTE — Patient Instructions (Signed)
Medication Instructions:  Your physician recommends that you continue on your current medications as directed. Please refer to the Current Medication list given to you today.  Testing/Procedures: Your physician has requested that you have an echocardiogram. Echocardiography is a painless test that uses sound waves to create images of your heart. It provides your doctor with information about the size and shape of your heart and how well your heart's chambers and valves are working. This procedure takes approximately one hour. There are no restrictions for this procedure.  This will be done at our [address] location:  [address]   ZIO XT- Long Term Monitor Instructions   Your physician has requested you wear your ZIO patch monitor___14___days.   This is a single patch monitor.  [employer] supplies one patch monitor per enrollment.  Additional stickers are not available.   Please do not apply patch if you will be having a Nuclear Stress Test, Echocardiogram, Cardiac CT, MRI, or Chest Xray during the time frame you would be wearing the monitor. The patch cannot be worn during these tests.  You cannot remove and re-apply the ZIO XT patch monitor.   Your ZIO patch monitor will be sent USPS Priority mail from [employer] directly to your home address. The monitor may also be mailed to a PO BOX if home delivery is not available.   It may take 3-5 days to receive your monitor after you have been enrolled.   Once you have received you monitor, please review enclosed instructions.  Your monitor has already been registered assigning a specific monitor serial # to you.   Applying the monitor   Shave hair from upper left chest.   Hold abrader disc by orange tab.  Rub abrader in 40 strokes over left upper chest as indicated in your monitor instructions.   Clean area with 4 enclosed alcohol pads .  Use all pads to assure are is cleaned thoroughly.  Let dry.   Apply patch  as indicated in monitor instructions.  Patch will be place under collarbone on left side of chest with arrow pointing upward.   Rub patch adhesive wings for 2 minutes.Remove white label marked "1".  Remove white label marked "2".  Rub patch adhesive wings for 2 additional minutes.   While looking in a mirror, press and release button in center of patch.  A small green light will flash 3-4 times .  This will be your only indicator the monitor has been turned on.     Do not shower for the first 24 hours.  You may shower after the first 24 hours.   Press button if you feel a symptom. You will hear a small click.  Record Date, Time and Symptom in the Patient Log Book.   When you are ready to remove patch, follow instructions on last 2 pages of Patient Log Book.  Stick patch monitor onto last page of Patient Log Book.   Place Patient Log Book in Blue box.  Use locking tab on box and tape box closed securely.  The Orange and White box has prepaid postage on it.  Please place in mailbox as soon as possible.  Your physician should have your test results approximately 7 days after the monitor has been mailed back to [employer].   Call [employer] Customer Care at [phone] if you have questions regarding your ZIO XT patch monitor.  Call them immediately if you see an orange light blinking on your   monitor.   If your monitor falls off in less than 4 days contact our Monitor department at [phone].  If your monitor becomes loose or falls off after 4 days call [employer] at [phone] for suggestions on securing your monitor.    Follow-Up: At [facility], you and your health needs are our priority.  As part of our continuing mission to provide you with exceptional heart care, we have created designated Provider Care Teams.  These Care Teams include your primary Cardiologist (physician) and Advanced Practice Providers (APPs -  Physician Assistants and Nurse Practitioners) who all work  together to provide you with the care you need, when you need it.  We recommend signing up for the patient portal called "MyChart".  Sign up information is provided on this After Visit Summary.  MyChart is used to connect with patients for Virtual Visits (Telemedicine).  Patients are able to view lab/test results, encounter notes, upcoming appointments, etc.  Non-urgent messages can be sent to your provider as well.   To learn more about what you can do with MyChart, go to [email or web address].    Your next appointment:   3 month(s)  The format for your next appointment:   In Person  Provider:   Christopher Schumann, MD    

## 2020-09-24 ENCOUNTER — Other Ambulatory Visit (INDEPENDENT_AMBULATORY_CARE_PROVIDER_SITE_OTHER): Payer: BC Managed Care – PPO

## 2020-09-24 DIAGNOSIS — R002 Palpitations: Secondary | ICD-10-CM | POA: Diagnosis not present

## 2020-10-13 ENCOUNTER — Ambulatory Visit (HOSPITAL_COMMUNITY): Payer: BC Managed Care – PPO | Attending: Cardiology

## 2020-10-13 ENCOUNTER — Other Ambulatory Visit: Payer: Self-pay

## 2020-10-13 DIAGNOSIS — R002 Palpitations: Secondary | ICD-10-CM | POA: Diagnosis not present

## 2020-10-13 LAB — ECHOCARDIOGRAM COMPLETE
Area-P 1/2: 3.77 cm2
S' Lateral: 3.3 cm

## 2020-10-22 ENCOUNTER — Encounter: Payer: Self-pay | Admitting: *Deleted

## 2020-11-18 ENCOUNTER — Ambulatory Visit: Payer: Self-pay | Admitting: Podiatry

## 2020-12-26 NOTE — Progress Notes (Deleted)
Cardiology Office Note:    Date:  12/26/2020   ID:  Cheryl Flynn, DOB 12-Aug-1976, MRN [id number]  PCP:  Remo Lipps, MD  Cardiologist:  No primary care provider on file.  Electrophysiologist:  None   Referring MD: Remo Lipps, MD   No chief complaint on file.   History of Present Illness:    Cheryl Flynn is a 45 y.o. female with a hx of iron deficiency who presents for follow-up.  She was referred by Dr. Imogene Burn for evaluation of palpitations, initially seen on 09/22/2020.  She reports that she had COVID-19 infection in January 2021.  Following this had syncopal episode while flying to [place]  Had an additional syncopal episode on flight back.  Reports she felt diaphoretic, had tunnel vision, and then passed out.  Reports only lost consciousness for a few seconds.  States that in August she started having palpitations.  Described as feeling like heart is fluttering.  Last for about 30 seconds, occurs about about once every 2 weeks.  She denies any lightheadedness or syncope during episodes.  States that she exercises 1-2 times a week by either doing a 30-minute walk for 20 to 30-minute cardio exercise.  She denies any chest pain or dyspnea.  No smoking history.  No history of heart disease in her immediate family.  Echocardiogram on 10/13/2020 showed normal biventricular function, no significant valvular disease.  Zio patch x14 days on 10/14/2020 showed no significant arrhythmias.  Since last clinic visit,   Past Medical History:  Diagnosis Date  . Iron deficiency 2018    Past Surgical History:  Procedure Laterality Date  . CESAREAN SECTION  2004    Current Medications: No outpatient medications have been marked as taking for the 12/28/20 encounter (Appointment) with Little Ishikawa, MD.     Allergies:   Patient has no known allergies.   Social History   Socioeconomic History  . Marital status: Single    Spouse name: Not on file  . Number of  children: Not on file  . Years of education: Not on file  . Highest education level: Not on file  Occupational History  . Not on file  Tobacco Use  . Smoking status: Never Smoker  . Smokeless tobacco: Never Used  Substance and Sexual Activity  . Alcohol use: Yes    Comment: 1 drink a month  . Drug use: No  . Sexual activity: Not on file  Other Topics Concern  . Not on file  Social History Narrative  . Not on file   Social Determinants of Health   Financial Resource Strain: Not on file  Food Insecurity: Not on file  Transportation Needs: Not on file  Physical Activity: Not on file  Stress: Not on file  Social Connections: Not on file     Family History: The patient's family history is negative for Breast cancer.  ROS:   Please see the history of present illness.     All other systems reviewed and are negative.  EKGs/Labs/Other Studies Reviewed:    The following studies were reviewed today:   EKG:  EKG is ordered today.  The ekg ordered today demonstrates normal sinus rhythm, rate 64, no ST/T abnormalities  Recent Labs: 07/13/2020: BUN 9; Creatinine, Ser 0.84; Hemoglobin 11.4; Platelets 243; Potassium 3.5; Sodium 133; TSH 1.675  Recent Lipid Panel    Component Value Date/Time   CHOL 171 07/23/2020 1435   TRIG 56 07/23/2020 1435   HDL 59  07/23/2020 1435   CHOLHDL 2.9 07/23/2020 1435   LDLCALC 101 (H) 07/23/2020 1435    Physical Exam:    VS:  There were no vitals taken for this visit.    Wt Readings from Last 3 Encounters:  09/22/20 204 lb (92.5 kg)  07/23/20 208 lb 9.6 oz (94.6 kg)  07/13/20 190 lb (86.2 kg)     GEN:  Well nourished, well developed in no acute distress HEENT: Normal NECK: No JVD; No carotid bruits LYMPHATICS: No lymphadenopathy CARDIAC: RRR, no murmurs, rubs, gallops RESPIRATORY:  Clear to auscultation without rales, wheezing or rhonchi  ABDOMEN: Soft, non-tender, non-distended MUSCULOSKELETAL:  No edema; No deformity  SKIN: Warm and  dry NEUROLOGIC:  Alert and oriented x 3 PSYCHIATRIC:  Normal affect   ASSESSMENT:    No diagnosis found. PLAN:    Palpitations: Zio patch x14 days on 10/14/2020 showed no significant arrhythmias.  Syncope: Reports 2 syncopal episodes in January after being diagnosed with COVID-19.  No syncopal episodes since.  Echocardiogram on 10/13/2020 showed normal biventricular function, no significant valvular disease.  Zio patch x14 days on 10/14/2020 showed no significant arrhythmias.  RTC in ***  Medication Adjustments/Labs and Tests Ordered: Current medicines are reviewed at length with the patient today.  Concerns regarding medicines are outlined above.  No orders of the defined types were placed in this encounter.  No orders of the defined types were placed in this encounter.   There are no Patient Instructions on file for this visit.   Signed, Little Ishikawa, MD  12/26/2020 3:08 PM    [facility]

## 2020-12-28 ENCOUNTER — Ambulatory Visit: Payer: BC Managed Care – PPO | Admitting: Cardiology

## 2021-03-23 ENCOUNTER — Other Ambulatory Visit: Payer: Self-pay | Admitting: Obstetrics & Gynecology

## 2021-03-23 DIAGNOSIS — Z1231 Encounter for screening mammogram for malignant neoplasm of breast: Secondary | ICD-10-CM

## 2021-05-12 ENCOUNTER — Other Ambulatory Visit: Payer: Self-pay

## 2021-05-12 ENCOUNTER — Ambulatory Visit
Admission: RE | Admit: 2021-05-12 | Discharge: 2021-05-12 | Disposition: A | Discharge status: Home / Self Care | Payer: BC Managed Care – PPO | Source: Ambulatory Visit | Attending: Obstetrics & Gynecology | Admitting: Obstetrics & Gynecology

## 2021-05-12 DIAGNOSIS — Z1231 Encounter for screening mammogram for malignant neoplasm of breast: Secondary | ICD-10-CM

## 2021-05-31 ENCOUNTER — Other Ambulatory Visit: Payer: Self-pay

## 2021-05-31 ENCOUNTER — Ambulatory Visit (INDEPENDENT_AMBULATORY_CARE_PROVIDER_SITE_OTHER): Payer: BC Managed Care – PPO | Admitting: Podiatry

## 2021-05-31 ENCOUNTER — Encounter: Payer: Self-pay | Admitting: Podiatry

## 2021-05-31 ENCOUNTER — Ambulatory Visit (INDEPENDENT_AMBULATORY_CARE_PROVIDER_SITE_OTHER): Payer: BC Managed Care – PPO

## 2021-05-31 DIAGNOSIS — L600 Ingrowing nail: Secondary | ICD-10-CM

## 2021-05-31 DIAGNOSIS — M7672 Peroneal tendinitis, left leg: Secondary | ICD-10-CM

## 2021-05-31 DIAGNOSIS — M778 Other enthesopathies, not elsewhere classified: Secondary | ICD-10-CM

## 2021-05-31 NOTE — Patient Instructions (Signed)

## 2021-05-31 NOTE — Progress Notes (Signed)
Subjective:   Patient ID: Cheryl Flynn, female   DOB: 45 y.o.   MRN: [id number]   HPI Patient presents with chronic ingrown toenails of the big toes of both feet and a lot of pain in the outside of the left foot that is been there for around 6 months.  States the nails have been this way for around a year gradually getting worse and making shoe gear more difficult and is failed to respond conservatively.  Patient does not smoke likes to be active   Review of Systems  All other systems reviewed and are negative.      Objective:  Physical Exam Vitals and nursing note reviewed.  Constitutional:      Appearance: She is well-developed.  Pulmonary:     Effort: Pulmonary effort is normal.  Musculoskeletal:        General: Normal range of motion.  Skin:    General: Skin is warm.  Neurological:     Mental Status: She is alert.    Neurovascular status intact muscle strength found to be adequate range of motion adequate with incurvation of the medial border of the hallux bilateral with slight redness but no drainage or other pathology with structural damage to the nailbed bilateral and inflammation around the peroneal insertion base of fifth metatarsal left foot.  Patient is found to have good digital perfusion and is well oriented x3     Assessment:  Chronic ingrown toenail deformity hallux bilateral medial border with pain along with peroneal tendinitis left     Plan:  H&P conditions reviewed and I discussed correction of the ingrown toenail patient wants this done.  I explained procedure risk patient is willing to accept the risk of surgery and at this point I infiltrated each hallux 60 mg Xylocaine Marcaine mixture sterile prep done and using sterile instrumentation remove the medial borders exposed matrix applied phenol 3 applications 30 seconds followed by alcohol lavage sterile dressing and gave instructions on soaks.  Patient also had sterile prep and injected the peroneal  insertion base of fifth metatarsal 3 mg Kenalog 5 mg Xylocaine and instructed on what to do to try to keep the pressure off the foot.  Patient to be seen back as needed  X-rays were negative for signs of fracture or arthritis base of fifth metatarsal left foot

## 2022-02-16 IMAGING — MG MM DIGITAL SCREENING BILAT W/ TOMO AND CAD
8 series · 8 of 24 positions shown · non-contrast
Comparison: Previous exam(s).

CLINICAL DATA: Screening.

EXAM:
DIGITAL SCREENING BILATERAL MAMMOGRAM WITH TOMOSYNTHESIS AND CAD
TECHNIQUE: Bilateral screening digital craniocaudal and mediolateral oblique
mammograms were obtained. Bilateral screening digital breast
tomosynthesis was performed. The images were evaluated with
computer-aided detection.

[L CC synth-2D]
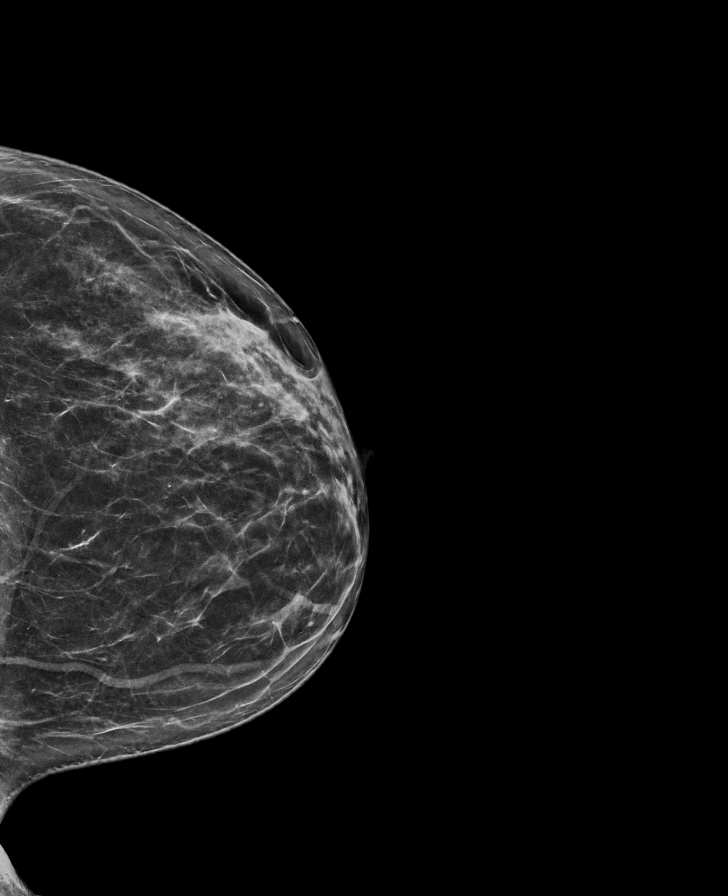

[R MLO synth-2D]
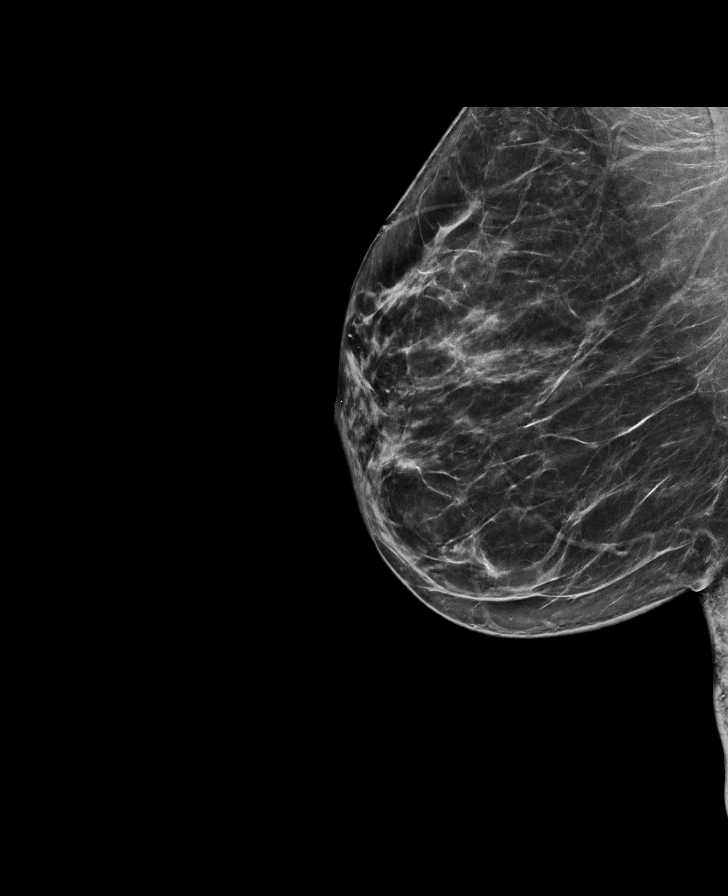

[R CC synth-2D]
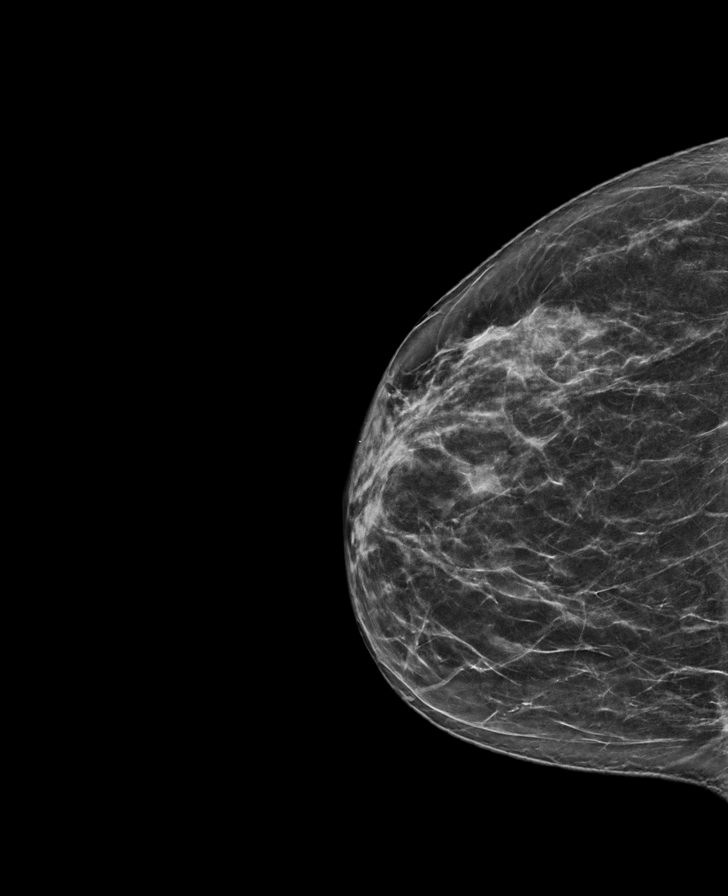

[L MLO synth-2D]
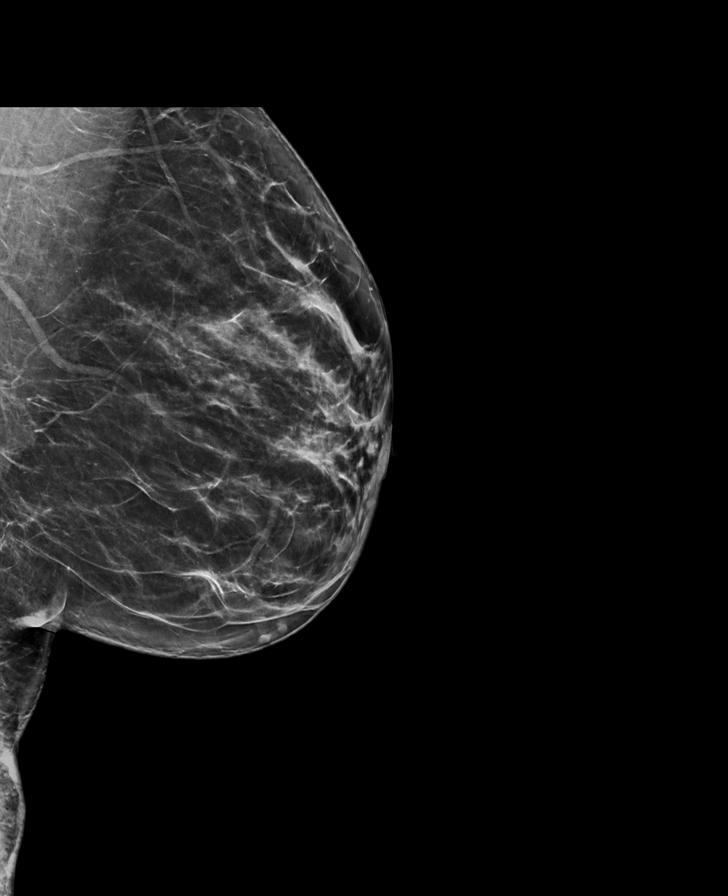

[R CC tomo · tomo slice 35/70.0]
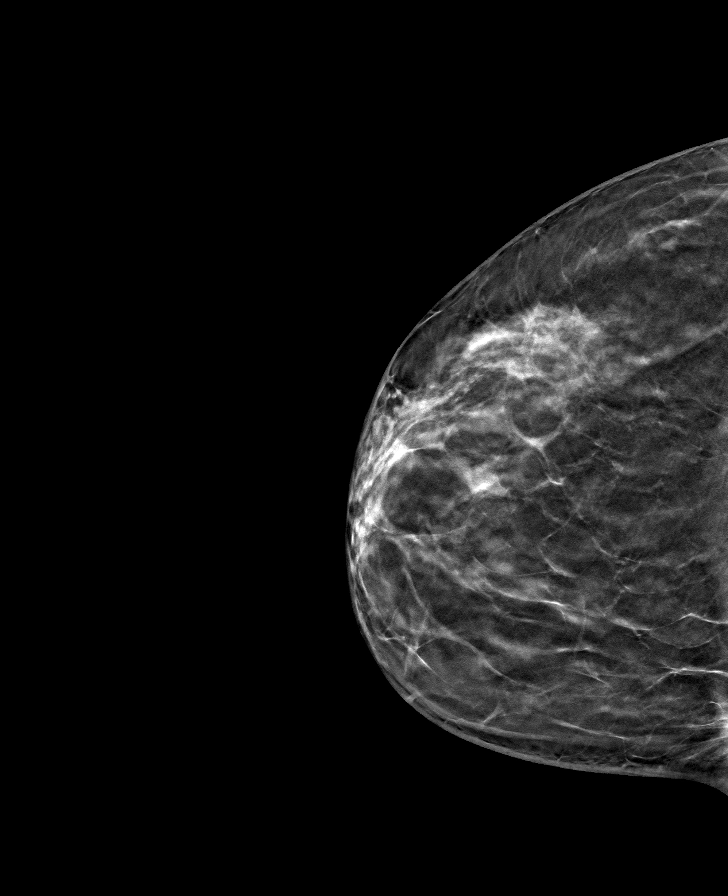

[L CC tomo · tomo slice 37/72.0]
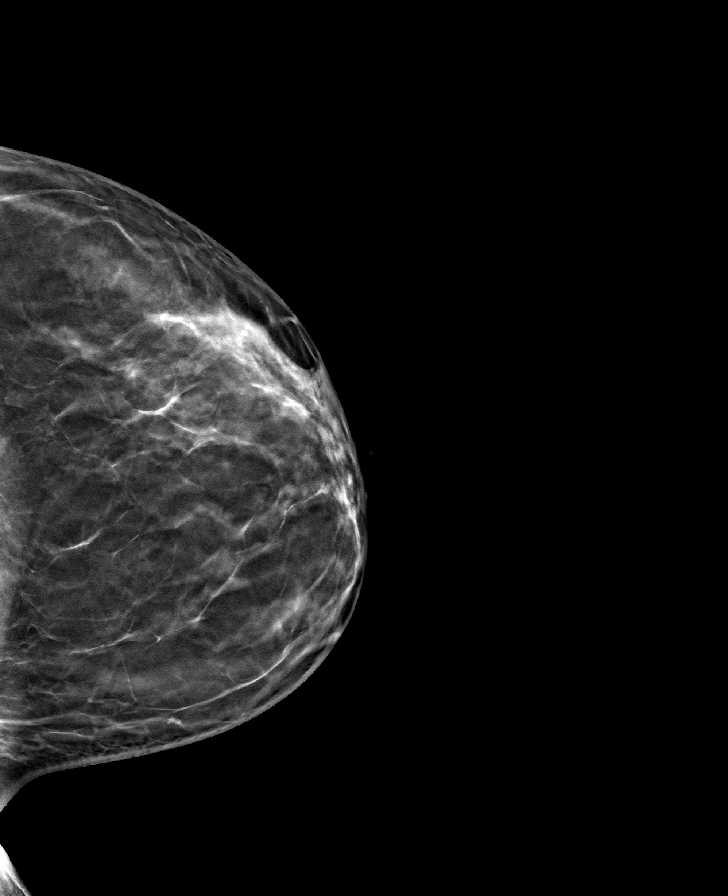

[R MLO tomo · tomo slice 39/77.0]
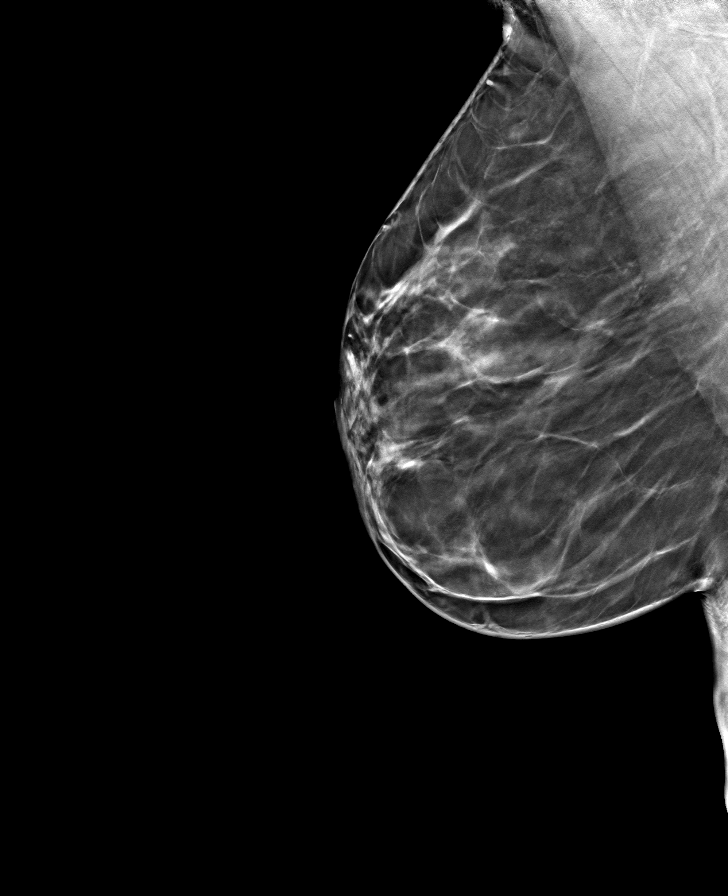

[L MLO tomo · tomo slice 39/77.0]
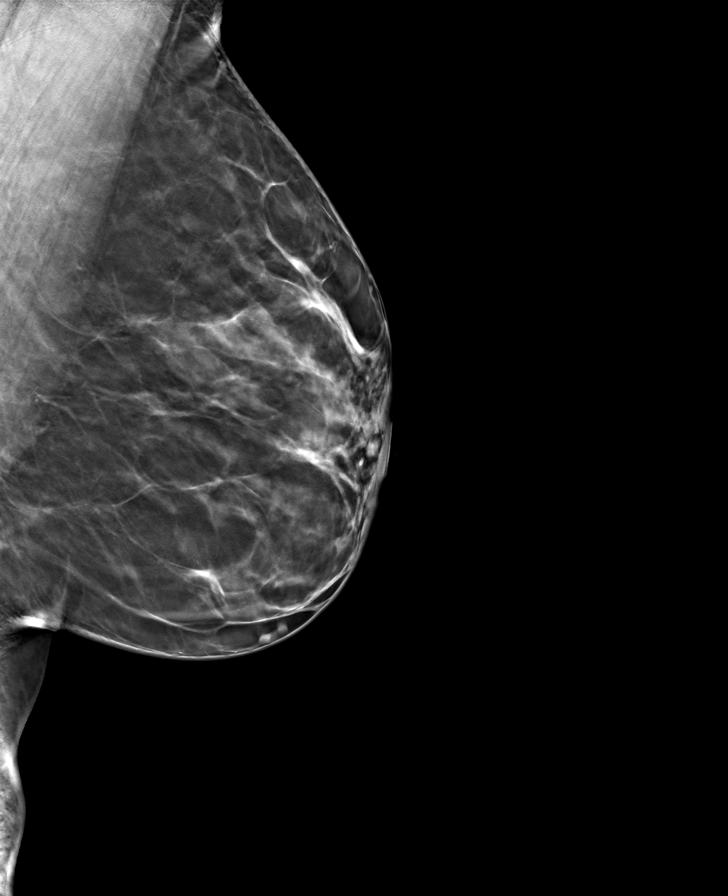

[8 of 24 positions shown; findings below may reference images not displayed]

ACR Breast Density Category b: There are scattered areas of
fibroglandular density.
FINDINGS: There are no findings suspicious for malignancy. The images were
evaluated with computer-aided detection.
IMPRESSION: No mammographic evidence of malignancy. A result letter of this
screening mammogram will be mailed directly to the patient.

RECOMMENDATION:
Screening mammogram in one year. (Code:WJ-I-BG6)

BI-RADS CATEGORY  1: Negative.

## 2022-06-13 ENCOUNTER — Other Ambulatory Visit: Payer: Self-pay | Admitting: Obstetrics & Gynecology

## 2022-06-13 ENCOUNTER — Ambulatory Visit
Admission: RE | Admit: 2022-06-13 | Discharge: 2022-06-13 | Disposition: A | Discharge status: Home / Self Care | Payer: BC Managed Care – PPO | Source: Ambulatory Visit | Attending: Obstetrics & Gynecology | Admitting: Obstetrics & Gynecology

## 2022-06-13 DIAGNOSIS — Z1231 Encounter for screening mammogram for malignant neoplasm of breast: Secondary | ICD-10-CM

## 2023-06-21 ENCOUNTER — Other Ambulatory Visit: Payer: Self-pay | Admitting: Nurse Practitioner

## 2023-06-21 DIAGNOSIS — Z1231 Encounter for screening mammogram for malignant neoplasm of breast: Secondary | ICD-10-CM

## 2024-04-15 ENCOUNTER — Emergency Department (HOSPITAL_BASED_OUTPATIENT_CLINIC_OR_DEPARTMENT_OTHER)
Admission: EM | Admit: 2024-04-15 | Discharge: 2024-04-15 | Disposition: A | Discharge status: Home / Self Care | Attending: Emergency Medicine | Admitting: Emergency Medicine

## 2024-04-15 ENCOUNTER — Other Ambulatory Visit: Payer: Self-pay

## 2024-04-15 ENCOUNTER — Emergency Department (HOSPITAL_BASED_OUTPATIENT_CLINIC_OR_DEPARTMENT_OTHER)

## 2024-04-15 DIAGNOSIS — R739 Hyperglycemia, unspecified: Secondary | ICD-10-CM | POA: Insufficient documentation

## 2024-04-15 DIAGNOSIS — R55 Syncope and collapse: Secondary | ICD-10-CM | POA: Insufficient documentation

## 2024-04-15 DIAGNOSIS — N289 Disorder of kidney and ureter, unspecified: Secondary | ICD-10-CM | POA: Diagnosis not present

## 2024-04-15 DIAGNOSIS — Z7982 Long term (current) use of aspirin: Secondary | ICD-10-CM | POA: Insufficient documentation

## 2024-04-15 DIAGNOSIS — S0990XA Unspecified injury of head, initial encounter: Secondary | ICD-10-CM | POA: Diagnosis not present

## 2024-04-15 LAB — PREGNANCY, URINE: Preg Test, Ur: NEGATIVE

## 2024-04-15 LAB — COMPREHENSIVE METABOLIC PANEL WITH GFR
ALT: 12 U/L (ref 0–44)
AST: 22 U/L (ref 15–41)
Albumin: 4.3 g/dL (ref 3.5–5.0)
Alkaline Phosphatase: 93 U/L (ref 38–126)
Anion gap: 12 (ref 5–15)
BUN: 12 mg/dL (ref 6–20)
CO2: 23 mmol/L (ref 22–32)
Calcium: 9.3 mg/dL (ref 8.9–10.3)
Chloride: 101 mmol/L (ref 98–111)
Creatinine, Ser: 1.33 mg/dL — ABNORMAL HIGH (ref 0.44–1.00)
GFR, Estimated: 49 mL/min — ABNORMAL LOW (ref >60)
Glucose, Bld: 139 mg/dL — ABNORMAL HIGH (ref 70–99)
Potassium: 4.3 mmol/L (ref 3.5–5.1)
Sodium: 135 mmol/L (ref 135–145)
Total Bilirubin: 0.5 mg/dL (ref 0.0–1.2)
Total Protein: 7.6 g/dL (ref 6.5–8.1)

## 2024-04-15 LAB — URINALYSIS, ROUTINE W REFLEX MICROSCOPIC
Bilirubin Urine: NEGATIVE
Glucose, UA: NEGATIVE mg/dL
Ketones, ur: NEGATIVE mg/dL
Leukocytes,Ua: NEGATIVE
Nitrite: NEGATIVE
Specific Gravity, Urine: 1.015 (ref 1.005–1.030)
pH: 6 (ref 5.0–8.0)

## 2024-04-15 LAB — CBC
HCT: 39 % (ref 36.0–46.0)
Hemoglobin: 13.4 g/dL (ref 12.0–15.0)
MCH: 26.6 pg (ref 26.0–34.0)
MCHC: 34.4 g/dL (ref 30.0–36.0)
MCV: 77.5 fL — ABNORMAL LOW (ref 80.0–100.0)
Platelets: 206 10*3/uL (ref 150–400)
RBC: 5.03 MIL/uL (ref 3.87–5.11)
RDW: 14.5 % (ref 11.5–15.5)
WBC: 11.4 10*3/uL — ABNORMAL HIGH (ref 4.0–10.5)
nRBC: 0 % (ref 0.0–0.2)

## 2024-04-15 LAB — RESP PANEL BY RT-PCR (RSV, FLU A&B, COVID)  RVPGX2
Influenza A by PCR: NEGATIVE
Influenza B by PCR: NEGATIVE
Resp Syncytial Virus by PCR: NEGATIVE
SARS Coronavirus 2 by RT PCR: NEGATIVE

## 2024-04-15 LAB — TROPONIN T, HIGH SENSITIVITY
Troponin T High Sensitivity: <15 ng/L (ref <19)
Troponin T High Sensitivity: <15 ng/L (ref <19)

## 2024-04-15 LAB — CBG MONITORING, ED: Glucose-Capillary: 142 mg/dL — ABNORMAL HIGH (ref 70–99)

## 2024-04-15 NOTE — ED Triage Notes (Signed)
 Pt POV ambulatory to room reporting syncopal episode while walking to the restroom tonight, hit head on bathroom floor. Pt also reports pelvic pain past few weeks, appt with OBGYN scheduled for Tuesday. Denies n/v/d, afebrile. Reports decreased appetite since Friday.

## 2024-04-15 NOTE — ED Provider Notes (Signed)
 [place] EMERGENCY DEPARTMENT AT [facility] Provider Note   CSN: [id number] Arrival date & time: 04/15/24  0035     History  Chief complaint: Syncope  Cheryl Flynn is a 48 y.o. female.  The history is provided by the patient.  She comes in following a syncopal episode at home.  She got up from bed to go to the bathroom and got dizzy and passed out.  There was associated nausea and diaphoresis as well as some chest pain.  She denies any palpitations.  She has been noticing that she feels generally weak.  She has been having some lower abdominal pain for which she has an appointment to see her gynecologist.  She has not had any menses for over 1 year.   Home Medications Prior to Admission medications   Medication Sig Start Date End Date Taking? Authorizing Provider  aspirin (ASPIRIN 81) 81 MG chewable tablet 1 tablet    [provider]  Fe Fum-FePoly-Vit C-Vit B3 (INTEGRA) 62.5-62.5-40-3 MG CAPS 1 capsule 07/20/15   [provider]      Allergies    Patient has no known allergies.    Review of Systems   Review of Systems  All other systems reviewed and are negative.   Physical Exam Updated Vital Signs BP 105/73 (BP Location: Right Arm)   Pulse 80   Temp 98.1 F (36.7 C) (Oral)   Resp 20   Ht 5\' 9"  (1.753 m)   Wt 89.8 kg   SpO2 98%   BMI 29.24 kg/m  Physical Exam Vitals and nursing note reviewed.   48 year old female, resting comfortably and in no acute distress. Vital signs are significant for slightly elevated respiratory rate. Oxygen saturation is 96%, which is normal. Head is normocephalic.  Small hematomas present on the right side of the forehead. PERRLA, EOMI. Oropharynx is clear. Neck is nontender. Lungs are clear without rales, wheezes, or rhonchi. Chest is nontender. Heart has regular rate and rhythm without murmur. Abdomen is soft, flat, with mild suprapubic tenderness. Extremities have no cyanosis or edema, full range  of motion is present. Skin is warm and dry without rash. Neurologic: Mental status is normal, cranial nerves are intact, moves all extremities equally.  ED Results / Procedures / Treatments   Labs (all labs ordered are listed, but only abnormal results are displayed) Labs Reviewed  COMPREHENSIVE METABOLIC PANEL WITH GFR - Abnormal; Notable for the following components:      Result Value   Glucose, Bld 139 (*)    Creatinine, Ser 1.33 (*)    GFR, Estimated 49 (*)    All other components within normal limits  CBC - Abnormal; Notable for the following components:   WBC 11.4 (*)    MCV 77.5 (*)    All other components within normal limits  URINALYSIS, ROUTINE W REFLEX MICROSCOPIC - Abnormal; Notable for the following components:   Hgb urine dipstick SMALL (*)    Protein, ur TRACE (*)    Bacteria, UA RARE (*)    All other components within normal limits  CBG MONITORING, ED - Abnormal; Notable for the following components:   Glucose-Capillary 142 (*)    All other components within normal limits  RESP PANEL BY RT-PCR (RSV, FLU A&B, COVID)  RVPGX2  PREGNANCY, URINE  TROPONIN T, HIGH SENSITIVITY  TROPONIN T, HIGH SENSITIVITY    EKG EKG Interpretation Date/Time:  Monday Apr 15 2024 00:45:20 EDT Ventricular Rate:  82 PR Interval:  166  QRS Duration:  80 QT Interval:  353 QTC Calculation: 413 R Axis:   82  Text Interpretation: Sinus rhythm Right atrial enlargement Baseline wander When compared with ECG of 07/13/2020, No significant change was found Confirmed by Alissa April ([id number]) on 04/15/2024 12:51:02 AM  Radiology CT Head Wo Contrast Result Date: 04/15/2024 CLINICAL DATA:  Head trauma, moderate-severe EXAM: CT HEAD WITHOUT CONTRAST TECHNIQUE: Contiguous axial images were obtained from the base of the skull through the vertex without intravenous contrast. RADIATION DOSE REDUCTION: This exam was performed according to the departmental dose-optimization program which includes automated  exposure control, adjustment of the mA and/or kV according to patient size and/or use of iterative reconstruction technique. COMPARISON:  None Available. FINDINGS: Brain: No evidence of large-territorial acute infarction. No parenchymal hemorrhage. No mass lesion. No extra-axial collection. No mass effect or midline shift. No hydrocephalus. Basilar cisterns are patent. Vascular: No hyperdense vessel. Skull: No acute fracture or focal lesion. Sinuses/Orbits: Paranasal sinuses and mastoid air cells are clear. The orbits are unremarkable. Other: None. IMPRESSION: No acute intracranial abnormality. Electronically Signed   By: Morgane  Naveau M.D.   On: 04/15/2024 01:40    Procedures Procedures  Cardiac monitor shows normal sinus rhythm, per my interpretation.  Medications Ordered in ED Medications - No data to display  ED Course/ Medical Decision Making/ A&P                                 Medical Decision Making Amount and/or Complexity of Data Reviewed Labs: ordered. Radiology: ordered.   Syncope which sounds most like orthostatic syncope although there are some components that are suggestive of vasovagal syncope.  I have reviewed her electrocardiogram, and my interpretation is nonspecific T wave flattening unchanged from prior.  I have ordered orthostatic vital signs be checked.  I have reviewed her initial laboratory tests and my interpretation is mild leukocytosis which is nonspecific, normal hemoglobin.  Metabolic panel and troponin are pending.  Orthostatic vital signs showed no significant change in heart rate or blood pressure.  I have reviewed her laboratory tests, and my interpretation is elevated random glucose level which will need to be followed as an outpatient, creatinine increased compared with baseline but most recent value was in 2021-no evidence of acute kidney injury, normal CBC, normal troponin x 2, normal urinalysis.  CT of head shows no acute intracranial injury.  I have  independently viewed the images, and agree with radiologist's interpretation.  Patient will need to follow-up with her primary care provider regarding creatinine and glucose levels.  I have advised her to be careful when changing positions, return for new or concerning symptoms.  Final Clinical Impression(s) / ED Diagnoses Final diagnoses:  Syncope and collapse  Renal insufficiency  Elevated random blood glucose level    Rx / DC Orders ED Discharge Orders     None         Alissa April, MD 04/15/24 [phone]

## 2024-04-15 NOTE — Discharge Instructions (Signed)
 Be careful when getting up from sitting or lying positions make sure that you are not feeling dizzy.  Return to the emergency department if you have new or concerning symptoms.

## 2024-04-16 ENCOUNTER — Other Ambulatory Visit: Payer: Self-pay | Admitting: Obstetrics & Gynecology

## 2024-04-16 DIAGNOSIS — K5900 Constipation, unspecified: Secondary | ICD-10-CM | POA: Diagnosis not present

## 2024-04-16 DIAGNOSIS — R10814 Left lower quadrant abdominal tenderness: Secondary | ICD-10-CM | POA: Diagnosis not present

## 2024-04-16 DIAGNOSIS — R102 Pelvic and perineal pain: Secondary | ICD-10-CM | POA: Diagnosis not present

## 2024-04-16 DIAGNOSIS — R7309 Other abnormal glucose: Secondary | ICD-10-CM | POA: Diagnosis not present

## 2024-04-16 DIAGNOSIS — R10813 Right lower quadrant abdominal tenderness: Secondary | ICD-10-CM | POA: Diagnosis not present

## 2024-04-16 DIAGNOSIS — R10811 Right upper quadrant abdominal tenderness: Secondary | ICD-10-CM | POA: Diagnosis not present

## 2024-04-16 DIAGNOSIS — Z1231 Encounter for screening mammogram for malignant neoplasm of breast: Secondary | ICD-10-CM

## 2024-04-16 DIAGNOSIS — R944 Abnormal results of kidney function studies: Secondary | ICD-10-CM | POA: Diagnosis not present

## 2024-04-16 DIAGNOSIS — R7989 Other specified abnormal findings of blood chemistry: Secondary | ICD-10-CM | POA: Diagnosis not present

## 2024-04-17 DIAGNOSIS — R102 Pelvic and perineal pain: Secondary | ICD-10-CM | POA: Diagnosis not present

## 2024-04-18 ENCOUNTER — Ambulatory Visit
Admission: RE | Admit: 2024-04-18 | Discharge: 2024-04-18 | Discharge status: Home / Self Care | Source: Ambulatory Visit

## 2024-04-18 DIAGNOSIS — Z1231 Encounter for screening mammogram for malignant neoplasm of breast: Secondary | ICD-10-CM

## 2024-04-22 DIAGNOSIS — R7989 Other specified abnormal findings of blood chemistry: Secondary | ICD-10-CM | POA: Diagnosis not present

## 2024-04-22 DIAGNOSIS — N951 Menopausal and female climacteric states: Secondary | ICD-10-CM | POA: Diagnosis not present

## 2024-04-22 DIAGNOSIS — R7309 Other abnormal glucose: Secondary | ICD-10-CM | POA: Diagnosis not present

## 2024-04-22 DIAGNOSIS — R944 Abnormal results of kidney function studies: Secondary | ICD-10-CM | POA: Diagnosis not present

## 2024-04-22 DIAGNOSIS — R61 Generalized hyperhidrosis: Secondary | ICD-10-CM | POA: Diagnosis not present

## 2024-04-22 DIAGNOSIS — R102 Pelvic and perineal pain: Secondary | ICD-10-CM | POA: Diagnosis not present

## 2024-04-23 ENCOUNTER — Ambulatory Visit: Payer: Self-pay

## 2024-04-23 NOTE — Telephone Encounter (Signed)
  Chief Complaint: abd pain,  Symptoms: abd pain,  Frequency: ongoing,  Pertinent Negatives: Patient denies fever, worsening SOB, blood in urine, GU s/s Disposition: [] ED /[] Urgent Care (no appt availability in office) / [x] Appointment(In office/virtual)/ []  [place] [facility]/ [] Home Care/ [] Refused Recommended Disposition /[] [place] [facility]/ []  Follow-up with PCP Additional Notes: Pt states that she was at OB/GYN yesterday, pt had CT and was told she had fibroids but would not have the pain as bad as she was. Pt was told yesterday at [facility] and was told that it was d/t menopause. Pt states that she SOB at this time, but can still perform her ADLs. States that she had palpitations in 2021 along with SOB at that time. Pt states that she coughs when taking a deep breath at this time. Pt states that it does not impact her ADLs. Pt states that she was told by the ED this past weekend to f/u with PCP after she had syncopal episode. Routing HP to clinic for scheduling.    Copied from CRM [id number]. Topic: Clinical - Red Word Triage >> Apr 23, 2024 12:53 PM Brittney F wrote: Red Word that prompted transfer to Nurse Triage:   Abdominal pain; shortness of breath; night sweats; heart palpitations and flutters Reason for Disposition . [1] MILD longstanding difficulty breathing AND [2]  SAME as normal  Answer Assessment - Initial Assessment Questions 1. RESPIRATORY STATUS: "Describe your breathing?" (e.g., wheezing, shortness of breath, unable to speak, severe coughing)      States coughs when takes a deep breath 2. ONSET: "When did this breathing problem begin?"      Ongoing issue 3. PATTERN "Does the difficult breathing come and go, or has it been constant since it started?"      intermittent 4. SEVERITY: "How bad is your breathing?" (e.g., mild, moderate, severe)    - MILD: No SOB at rest, mild SOB with walking, speaks normally in sentences, can lie down, no retractions, pulse < 100.    -  MODERATE: SOB at rest, SOB with minimal exertion and prefers to sit, cannot lie down flat, speaks in phrases, mild retractions, audible wheezing, pulse 100-120.    - SEVERE: Very SOB at rest, speaks in single words, struggling to breathe, sitting hunched forward, retractions, pulse > 120      mild 5. RECURRENT SYMPTOM: "Have you had difficulty breathing before?" If Yes, ask: "When was the last time?" and "What happened that time?"      denies 6. CARDIAC HISTORY: "Do you have any history of heart disease?" (e.g., heart attack, angina, bypass surgery, angioplasty)      denies 7. LUNG HISTORY: "Do you have any history of lung disease?"  (e.g., pulmonary embolus, asthma, emphysema)     denies 8. CAUSE: "What do you think is causing the breathing problem?"      unsure 9. OTHER SYMPTOMS: "Do you have any other symptoms? (e.g., dizziness, runny nose, cough, chest pain, fever)     denies 12. TRAVEL: "Have you traveled out of the country in the last month?" (e.g., travel history, exposures)       denies  Protocols used: Breathing Difficulty-A-AH

## 2024-04-24 ENCOUNTER — Ambulatory Visit: Payer: Self-pay

## 2024-04-24 NOTE — Telephone Encounter (Signed)
 Copied from CRM [id number]. Topic: Clinical - Red Word Triage >> Apr 23, 2024 12:53 PM Cheryl Flynn F wrote: Red Word that prompted transfer to Nurse Triage:   Abdominal pain; shortness of breath; night sweats; heart palpitations and flutters >> Apr 24, 2024 10:39 AM Cheryl Flynn wrote: Patient called back due to did not get a call back yesterday from the nurse line stated. Called NT line and transferred to [facility]

## 2024-04-24 NOTE — Telephone Encounter (Signed)
 Copied from CRM [id number]. Topic: Clinical - Red Word Triage >> Apr 23, 2024 12:53 PM Danelle Dunning F wrote: Red Word that prompted transfer to Nurse Triage:   Abdominal pain; shortness of breath; night sweats; heart palpitations and flutters  04/24/24 Patient called back for appointment.  This RN was able to schedule.

## 2024-04-30 NOTE — Telephone Encounter (Signed)
 Patient has appointments with doctors this am.

## 2024-05-01 ENCOUNTER — Encounter: Payer: Self-pay | Admitting: Nurse Practitioner

## 2024-05-01 ENCOUNTER — Ambulatory Visit: Payer: Self-pay | Admitting: Nurse Practitioner

## 2024-05-01 VITALS — BP 127/86 | HR 56 | Temp 98.4°F | Wt 197.6 lb

## 2024-05-01 DIAGNOSIS — K5900 Constipation, unspecified: Secondary | ICD-10-CM | POA: Diagnosis not present

## 2024-05-01 DIAGNOSIS — R1030 Lower abdominal pain, unspecified: Secondary | ICD-10-CM

## 2024-05-01 LAB — POCT URINALYSIS DIP (CLINITEK)
Bilirubin, UA: NEGATIVE
Blood, UA: NEGATIVE
Glucose, UA: NEGATIVE mg/dL
Ketones, POC UA: NEGATIVE mg/dL
Leukocytes, UA: NEGATIVE
Nitrite, UA: NEGATIVE
POC PROTEIN,UA: NEGATIVE
Spec Grav, UA: 1.015 (ref 1.010–1.025)
Urobilinogen, UA: 1 U/dL
pH, UA: 7 (ref 5.0–8.0)

## 2024-05-01 MED ORDER — DOCUSATE SODIUM 100 MG PO CAPS: 100.0000 mg | ORAL_CAPSULE | Freq: Two times a day (BID) | ORAL | 0 refills | Status: AC

## 2024-05-01 NOTE — Progress Notes (Signed)
 Subjective   Patient ID: Cheryl Flynn, female    DOB: 1976-07-10, 48 y.o.   MRN: [id number]  Chief Complaint  Patient presents with   Hospitalization Follow-up    Patient stated that she still is having lower abdomen pain    Referring provider: Ozan, Jennifer, DO  Cheryl Flynn is a 48 y.o. female with Past Medical History: 2018: Iron deficiency    HPI  Patient presents today to establish care.  She was recently seen in the emergency room for abdominal pain.  She did also have a syncopal episode.  She was referred to OB/GYN.  She did have an ultrasound which showed a small fibroid.  Patient continues to have lower abdominal pain.  This has been going on for about a month now.  UA in office today was clear.  She has been having issues with constipation and has been taking MiraLAX twice daily.  Her bowel movements are still small and hard.  We will trial Colace twice daily and place referral to GI. Denies f/c/s, n/v/d, hemoptysis, PND, leg swelling Denies chest pain or edema        No Known Allergies  Immunization History  Administered Date(s) Administered   DTaP 12/05/2016   Moderna Sars-Covid-2 Vaccination 01/06/2020, 02/03/2020   Tdap 12/05/2016    Tobacco History: Social History   Tobacco Use  Smoking Status Never  Smokeless Tobacco Never   Counseling given: Not Answered   Outpatient Encounter Medications as of 05/01/2024  Medication Sig   aspirin (ASPIRIN 81) 81 MG chewable tablet 1 tablet   docusate sodium (COLACE) 100 MG capsule Take 1 capsule (100 mg total) by mouth 2 (two) times daily.   estradiol (VIVELLE-DOT) 0.025 MG/24HR 1 patch to skin on lower abdomen or buttocks Transdermal Two times a Week for 30 days   phentermine (ADIPEX-P) 37.5 MG tablet Take 37.5 mg by mouth daily.   progesterone (PROMETRIUM) 100 MG capsule 1 capsule at bedtime Orally Once a day for 90 days   Fe Fum-FePoly-Vit C-Vit B3 (INTEGRA) 62.5-62.5-40-3 MG CAPS 1 capsule    No facility-administered encounter medications on file as of 05/01/2024.    Review of Systems  Review of Systems  Constitutional: Negative.   HENT: Negative.    Cardiovascular: Negative.   Gastrointestinal: Negative.   Allergic/Immunologic: Negative.   Neurological: Negative.   Psychiatric/Behavioral: Negative.       Objective:   BP 127/86   Pulse (!) 56   Temp 98.4 F (36.9 C) (Oral)   Wt 197 lb 9.6 oz (89.6 kg)   SpO2 95%   BMI 29.18 kg/m   Wt Readings from Last 5 Encounters:  05/01/24 197 lb 9.6 oz (89.6 kg)  04/15/24 198 lb (89.8 kg)  09/22/20 204 lb (92.5 kg)  07/23/20 208 lb 9.6 oz (94.6 kg)  07/13/20 190 lb (86.2 kg)     Physical Exam Vitals and nursing note reviewed.  Constitutional:      General: She is not in acute distress.    Appearance: She is well-developed.  Cardiovascular:     Rate and Rhythm: Normal rate and regular rhythm.  Pulmonary:     Effort: Pulmonary effort is normal.     Breath sounds: Normal breath sounds.  Abdominal:     Tenderness: There is abdominal tenderness.  Neurological:     Mental Status: She is alert and oriented to person, place, and time.       Assessment & Plan:   Constipation, unspecified constipation type -  Docusate Sodium ; Take 1 capsule (100 mg total) by mouth 2 (two) times daily.  Dispense: 10 capsule; Refill: 0 -     Ambulatory referral to Gastroenterology  Lower abdominal pain -     POCT URINALYSIS DIP (CLINITEK) -     Ambulatory referral to Gastroenterology     No follow-ups on file.   Jerrlyn Morel, NP 05/01/2024

## 2024-05-08 ENCOUNTER — Encounter: Payer: Self-pay | Admitting: Physician Assistant

## 2024-06-09 NOTE — Progress Notes (Unsigned)
 Ellouise Console, PA-C [address] [place], [place]  [address] Phone: [phone]   Gastroenterology Consultation  Referring Provider:     Marilynn Nest, DO Primary Care Physician:  Ozan, Jennifer, DO Primary Gastroenterologist:  Ellouise Console, PA-C / Dr. Gordy Starch  Reason for Consultation:     Chronic constipation, lower abdominal pain        HPI:   Cheryl Flynn is a 48 y.o. y/o female referred for consultation & management  by Ozan, Jennifer, DO.    Current Symptoms:  Intermittent lower abdominal pain for 3 months.  Comes and goes.  Worse with movement.  She has been working out and exercising more.  Her pain started after that.  She has chronic constipation and lower abdominal pain.  Currently taking OTC Metamucil. Has BM twice per week with hard stools and straining.  Has had lifelong constipation.  Denies rectal bleeding, weight loss, or alarm symptoms.  No previous GI evaluation or colonoscopy.   No family history of Colon Cancer.  She is currently due for her first screening colonoscopy.  Recent OB/GYN evaluation was unrevealing.  Pelvic ultrasound showed small uterine fibroid.  UA negative.  No menstrual cycle in over a year.  No recent abdominal pelvic CT.  04/15/2024 labs: Negative urine pregnancy test.  Mild elevated WBC 11.4.  Normal hemoglobin 13.4.  MCV 77.  Glucose 139, creatinine 1.33, GFR 49.  Normal LFTs.  Past Medical History:  Diagnosis Date   Heart palpitations    Iron deficiency 2018    Past Surgical History:  Procedure Laterality Date   CESAREAN SECTION  2004    Prior to Admission medications   Medication Sig Start Date End Date Taking? Authorizing Provider  aspirin (ASPIRIN 81) 81 MG chewable tablet 1 tablet    [provider]  docusate sodium  (COLACE) 100 MG capsule Take 1 capsule (100 mg total) by mouth 2 (two) times daily. 05/01/24   Oley Bascom RAMAN, NP  estradiol  (VIVELLE -DOT) 0.025 MG/24HR 1 patch to skin on lower abdomen  or buttocks Transdermal Two times a Week for 30 days 04/22/24   [provider]  Fe Fum-FePoly-Vit C-Vit B3 (INTEGRA) 62.5-62.5-40-3 MG CAPS 1 capsule 07/20/15   [provider]  phentermine (ADIPEX-P) 37.5 MG tablet Take 37.5 mg by mouth daily. 04/09/24   [provider]  progesterone (PROMETRIUM) 100 MG capsule 1 capsule at bedtime Orally Once a day for 90 days 04/22/24   [provider]    Family History  Problem Relation Age of Onset   Breast cancer Neg Hx    Esophageal cancer Neg Hx    Colon cancer Neg Hx      Social History   Tobacco Use   Smoking status: Never   Smokeless tobacco: Never  Vaping Use   Vaping status: Never Used  Substance Use Topics   Alcohol use: Yes    Comment: 1 drink a month   Drug use: No    Allergies as of 06/11/2024   (No Known Allergies)    Review of Systems:    All systems reviewed and negative except where noted in HPI.   Physical Exam:  BP 112/84   Pulse 79   Ht 5' 9 (1.753 m)   Wt 192 lb (87.1 kg)   SpO2 96%   BMI 28.35 kg/m  No LMP recorded. Patient is perimenopausal.  General:   Alert,  Well-developed, well-nourished, pleasant and cooperative in NAD Lungs:  Respirations  even and unlabored.  Clear throughout to auscultation.   No wheezes, crackles, or rhonchi. No acute distress. Heart:  Regular rate and rhythm; no murmurs, clicks, rubs, or gallops. Abdomen:  Normal bowel sounds.  No bruits.  Soft, and non-distended without masses, hepatosplenomegaly or hernias noted.  There is No abdominal Tenderness when she is relaxed and laying supine.  NO LLQ or RLQ tenderness.  When she raises her head to flex her abdominal muscles, and when she goes from laying down to sitting up, then she has lower abdominal pain and tenderness with movement.    No guarding or rebound tenderness.    Neurologic:  Alert and oriented x3;  grossly normal neurologically. Psych:  Alert and cooperative. Normal mood and  affect.  Imaging Studies: No results found.  Labs: CBC    Component Value Date/Time   WBC 11.4 (H) 04/15/2024 0045   RBC 5.03 04/15/2024 0045   HGB 13.4 04/15/2024 0045   HCT 39.0 04/15/2024 0045   PLT 206 04/15/2024 0045   MCV 77.5 (L) 04/15/2024 0045   MCH 26.6 04/15/2024 0045   MCHC 34.4 04/15/2024 0045   RDW 14.5 04/15/2024 0045    CMP     Component Value Date/Time   NA 135 04/15/2024 0045   K 4.3 04/15/2024 0045   CL 101 04/15/2024 0045   CO2 23 04/15/2024 0045   GLUCOSE 139 (H) 04/15/2024 0045   BUN 12 04/15/2024 0045   CREATININE 1.33 (H) 04/15/2024 0045   CALCIUM 9.3 04/15/2024 0045   PROT 7.6 04/15/2024 0045   ALBUMIN 4.3 04/15/2024 0045   AST 22 04/15/2024 0045   ALT 12 04/15/2024 0045   ALKPHOS 93 04/15/2024 0045   BILITOT 0.5 04/15/2024 0045   GFRNONAA 49 (L) 04/15/2024 0045   GFRAA >60 07/13/2020 1939    Assessment and Plan:   Cheryl Flynn is a 48 y.o. y/o female has been referred for:  1.  Lower abdominal pain: GYN exam, labs, pelvic ultrasound, UA all unrevealing.  Most consistent with Musculoskeletal pain versus constipation.  I am NOT suspicious for diverticulitis today based on history and physical exam. - If she has recurrent severe lower abdominal pain, then abdominal pelvic CT is the next step.  2.  Chronic constipation - Continue Metamucil - Take Miralax 17g once daily every day. - - Recommend High Fiber diet with fruits, vegetables, and whole grains. - Drink 64 ounces of Fluids Daily.  - If constipation does not improve on MiraLAX and Metamucil, then try Linzess.  3.  Colon cancer screening: Currently due for first screening colonoscopy due to age. - Scheduling Colonoscopy I discussed risks of colonoscopy with patient to include risk of bleeding, colon perforation, and risk of sedation.  Patient expressed understanding and agrees to proceed with colonoscopy.   Follow up as needed based on colonoscopy results and GI  symptoms.  Ellouise Console, PA-C

## 2024-06-11 ENCOUNTER — Encounter: Payer: Self-pay | Admitting: Internal Medicine

## 2024-06-11 ENCOUNTER — Encounter: Payer: Self-pay | Admitting: Physician Assistant

## 2024-06-11 ENCOUNTER — Ambulatory Visit (INDEPENDENT_AMBULATORY_CARE_PROVIDER_SITE_OTHER): Admitting: Physician Assistant

## 2024-06-11 VITALS — BP 112/84 | HR 79 | Ht 69.0 in | Wt 192.0 lb

## 2024-06-11 DIAGNOSIS — R1030 Lower abdominal pain, unspecified: Secondary | ICD-10-CM | POA: Diagnosis not present

## 2024-06-11 DIAGNOSIS — K5900 Constipation, unspecified: Secondary | ICD-10-CM

## 2024-06-11 DIAGNOSIS — Z1211 Encounter for screening for malignant neoplasm of colon: Secondary | ICD-10-CM

## 2024-06-11 DIAGNOSIS — K5909 Other constipation: Secondary | ICD-10-CM

## 2024-06-11 MED ORDER — NA SULFATE-K SULFATE-MG SULF 17.5-3.13-1.6 GM/177ML PO SOLN: 1.0000 | Freq: Once | ORAL | 0 refills | Status: AC

## 2024-06-11 NOTE — Patient Instructions (Signed)
 Start Miralax 1 capful (17g) daily in 8 ounces of liquid.  You have been scheduled for a Colonoscopy. Please follow written instructions given to you at your visit today.   If you use inhalers (even only as needed), please bring them with you on the day of your procedure.  DO NOT TAKE 7 DAYS PRIOR TO TEST- Trulicity (dulaglutide) Ozempic, Wegovy (semaglutide) Mounjaro (tirzepatide) Bydureon Bcise (exanatide extended release)  DO NOT TAKE 1 DAY PRIOR TO YOUR TEST Rybelsus (semaglutide) Adlyxin (lixisenatide) Victoza (liraglutide) Byetta (exanatide) ___________________________________________________________________________  Please follow up sooner if symptoms increase or worsen   Due to recent changes in healthcare laws, you may see the results of your imaging and laboratory studies on MyChart before your provider has had a chance to review them.  We understand that in some cases there may be results that are confusing or concerning to you. Not all laboratory results come back in the same time frame and the provider may be waiting for multiple results in order to interpret others.  Please give us  48 hours in order for your provider to thoroughly review all the results before contacting the office for clarification of your results.   Thank you for trusting me with your gastrointestinal care!   Ellouise Console, PA-C _______________________________________________________  If your blood pressure at your visit was 140/90 or greater, please contact your primary care physician to follow up on this.  _______________________________________________________  If you are age 46 or older, your body mass index should be between 23-30. Your Body mass index is 28.35 kg/m. If this is out of the aforementioned range listed, please consider follow up with your Primary Care Provider.  If you are age 47 or younger, your body mass index should be between 19-25. Your Body mass index is 28.35 kg/m. If this  is out of the aformentioned range listed, please consider follow up with your Primary Care Provider.   ________________________________________________________  The [facility] providers would like to encourage you to use MYCHART to communicate with providers for non-urgent requests or questions.  Due to long hold times on the telephone, sending your provider a message by [facility] may be a faster and more efficient way to get a response.  Please allow 48 business hours for a response.  Please remember that this is for non-urgent requests.  _______________________________________________________

## 2024-06-17 ENCOUNTER — Ambulatory Visit (AMBULATORY_SURGERY_CENTER): Admitting: Internal Medicine

## 2024-06-17 ENCOUNTER — Encounter: Payer: Self-pay | Admitting: Internal Medicine

## 2024-06-17 VITALS — BP 141/92 | HR 61 | Temp 97.2°F | Resp 13 | Ht 69.0 in | Wt 192.0 lb

## 2024-06-17 DIAGNOSIS — K5909 Other constipation: Secondary | ICD-10-CM

## 2024-06-17 DIAGNOSIS — Z1211 Encounter for screening for malignant neoplasm of colon: Secondary | ICD-10-CM

## 2024-06-17 DIAGNOSIS — K6389 Other specified diseases of intestine: Secondary | ICD-10-CM | POA: Diagnosis not present

## 2024-06-17 MED ORDER — SODIUM CHLORIDE 0.9 % IV SOLN: 500.0000 mL | INTRAVENOUS | Status: DC

## 2024-06-17 NOTE — Progress Notes (Signed)
 See office note dated 06/11/2024 for details of current H&P  Patient presenting for screening colonoscopy  Patient remains appropriate for colonoscopy in the [facility] today.

## 2024-06-17 NOTE — Op Note (Signed)
 [facility] Patient Name: Cheryl Flynn Procedure Date: 06/17/2024 3:28 PM MRN: [id number] Endoscopist: Gordy CHRISTELLA Starch , MD, [id number] Age: 48 Referring MD:  Date of Birth: 12-18-1975 Gender: Female Account #: [id number] Procedure:                Colonoscopy Indications:              Screening for colorectal malignant neoplasm, This                            is the patient's first colonoscopy Medicines:                Monitored Anesthesia Care Procedure:                Pre-Anesthesia Assessment:                           - Prior to the procedure, a History and Physical                            was performed, and patient medications and                            allergies were reviewed. The patient's tolerance of                            previous anesthesia was also reviewed. The risks                            and benefits of the procedure and the sedation                            options and risks were discussed with the patient.                            All questions were answered, and informed consent                            was obtained. Prior Anticoagulants: The patient has                            taken no anticoagulant or antiplatelet agents. ASA                            Grade Assessment: II - A patient with mild systemic                            disease. After reviewing the risks and benefits,                            the patient was deemed in satisfactory condition to                            undergo the procedure.  After obtaining informed consent, the colonoscope                            was passed under direct vision. Throughout the                            procedure, the patient's blood pressure, pulse, and                            oxygen saturations were monitored continuously. The                            CF [id number] #[id number] was introduced through the anus                            and advanced to  the terminal ileum. The colonoscopy                            was performed without difficulty. The patient                            tolerated the procedure well. The quality of the                            bowel preparation was good. The terminal ileum,                            ileocecal valve, appendiceal orifice, and rectum                            were photographed. Scope In: 3:37:44 PM Scope Out: 3:52:37 PM Scope Withdrawal Time: 0 hours 7 minutes 51 seconds  Total Procedure Duration: 0 hours 14 minutes 53 seconds  Findings:                 The digital rectal exam was normal.                           The terminal ileum appeared normal.                           A diffuse area of mild melanosis was found in the                            entire colon.                           The exam was otherwise without abnormality on                            direct and retroflexion views. Complications:            No immediate complications. Estimated Blood Loss:     Estimated blood loss: none. Impression:               - The examined portion of the  ileum was normal.                           - Melanosis in the colon.                           - The examination was otherwise normal on direct                            and retroflexion views.                           - No specimens collected. Recommendation:           - Patient has a contact number available for                            emergencies. The signs and symptoms of potential                            delayed complications were discussed with the                            patient. Return to normal activities tomorrow.                            Written discharge instructions were provided to the                            patient.                           - Resume previous diet.                           - Continue present medications. High suspicion for                            IBS-C. If MiraLax ineffective Linzess  trial                            recommended.                           - Repeat colonoscopy in 10 years for screening                            purposes. Gordy CHRISTELLA Starch, MD 06/17/2024 3:55:43 PM This report has been signed electronically.

## 2024-06-17 NOTE — Progress Notes (Signed)
 Vss nad trans to pacu

## 2024-06-17 NOTE — Patient Instructions (Addendum)
-   Resume previous diet. - Continue present medications. High suspicion for IBS-C. If MiraLax ineffective Linzess trial recommended. - Repeat colonoscopy in 10 years for screening purposes.  YOU HAD AN ENDOSCOPIC PROCEDURE TODAY AT THE [facility]:   Refer to the procedure report that was given to you for any specific questions about what was found during the examination.  If the procedure report does not answer your questions, please call your gastroenterologist to clarify.  If you requested that your care partner not be given the details of your procedure findings, then the procedure report has been included in a sealed envelope for you to review at your convenience later.  YOU SHOULD EXPECT: Some feelings of bloating in the abdomen. Passage of more gas than usual.  Walking can help get rid of the air that was put into your GI tract during the procedure and reduce the bloating. If you had a lower endoscopy (such as a colonoscopy or flexible sigmoidoscopy) you may notice spotting of blood in your stool or on the toilet paper. If you underwent a bowel prep for your procedure, you may not have a normal bowel movement for a few days.  Please Note:  You might notice some irritation and congestion in your nose or some drainage.  This is from the oxygen used during your procedure.  There is no need for concern and it should clear up in a day or so.  SYMPTOMS TO REPORT IMMEDIATELY:  Following lower endoscopy (colonoscopy or flexible sigmoidoscopy):  Excessive amounts of blood in the stool  Significant tenderness or worsening of abdominal pains  Swelling of the abdomen that is new, acute  Fever of 100F or higher  For urgent or emergent issues, a gastroenterologist can be reached at any hour by calling [phone]. Do not use MyChart messaging for urgent concerns.    DIET:  We do recommend a small meal at first, but then you may proceed to your regular diet.  Drink plenty of fluids but  you should avoid alcoholic beverages for 24 hours.  ACTIVITY:  You should plan to take it easy for the rest of today and you should NOT DRIVE or use heavy machinery until tomorrow (because of the sedation medicines used during the test).    FOLLOW UP: Our staff will call the number listed on your records the next business day following your procedure.  We will call around 7:15- 8:00 am to check on you and address any questions or concerns that you may have regarding the information given to you following your procedure. If we do not reach you, we will leave a message.     If any biopsies were taken you will be contacted by phone or by letter within the next 1-3 weeks.  Please call us  at [phone] if you have not heard about the biopsies in 3 weeks.    SIGNATURES/CONFIDENTIALITY: You and/or your care partner have signed paperwork which will be entered into your electronic medical record.  These signatures attest to the fact that that the information above on your After Visit Summary has been reviewed and is understood.  Full responsibility of the confidentiality of this discharge information lies with you and/or your care-partner.

## 2024-06-18 ENCOUNTER — Telehealth: Payer: Self-pay | Admitting: *Deleted

## 2024-06-18 NOTE — Telephone Encounter (Signed)
 Patient stated she is doing okay and having no complications.

## 2024-06-18 NOTE — Telephone Encounter (Signed)
 Attempted to contact pt for follow up call. No answer, left a message

## 2024-07-19 ENCOUNTER — Other Ambulatory Visit (HOSPITAL_COMMUNITY): Payer: Self-pay

## 2024-07-19 MED ORDER — ESTRADIOL 0.025 MG/24HR TD PTTW
MEDICATED_PATCH | TRANSDERMAL | 2 refills | Status: AC
  Filled 2024-07-19: qty 8, 28d supply, fill #0

## 2025-04-30 ENCOUNTER — Ambulatory Visit: Payer: Self-pay | Admitting: Nurse Practitioner
